# Patient Record
Sex: Female | Born: 1935 | Race: White | Hispanic: No | Marital: Married | Smoking: Never smoker
Health system: Southern US, Community
[De-identification: ages and names within clinical notes are randomized; demographics above are authoritative.]

## PROBLEM LIST (undated history)

## (undated) DIAGNOSIS — I4891 Unspecified atrial fibrillation: Secondary | ICD-10-CM

## (undated) DIAGNOSIS — M542 Cervicalgia: Secondary | ICD-10-CM

## (undated) DIAGNOSIS — M1710 Unilateral primary osteoarthritis, unspecified knee: Secondary | ICD-10-CM

## (undated) DIAGNOSIS — G4730 Sleep apnea, unspecified: Secondary | ICD-10-CM

## (undated) DIAGNOSIS — K649 Unspecified hemorrhoids: Secondary | ICD-10-CM

## (undated) DIAGNOSIS — F32A Depression, unspecified: Secondary | ICD-10-CM

## (undated) DIAGNOSIS — I639 Cerebral infarction, unspecified: Secondary | ICD-10-CM

## (undated) DIAGNOSIS — M179 Osteoarthritis of knee, unspecified: Secondary | ICD-10-CM

## (undated) DIAGNOSIS — K219 Gastro-esophageal reflux disease without esophagitis: Secondary | ICD-10-CM

## (undated) DIAGNOSIS — I10 Essential (primary) hypertension: Secondary | ICD-10-CM

## (undated) DIAGNOSIS — G8929 Other chronic pain: Secondary | ICD-10-CM

## (undated) DIAGNOSIS — G459 Transient cerebral ischemic attack, unspecified: Secondary | ICD-10-CM

## (undated) DIAGNOSIS — F0390 Unspecified dementia without behavioral disturbance: Secondary | ICD-10-CM

## (undated) DIAGNOSIS — R7303 Prediabetes: Secondary | ICD-10-CM

## (undated) DIAGNOSIS — N189 Chronic kidney disease, unspecified: Secondary | ICD-10-CM

## (undated) DIAGNOSIS — F329 Major depressive disorder, single episode, unspecified: Secondary | ICD-10-CM

## (undated) DIAGNOSIS — F419 Anxiety disorder, unspecified: Secondary | ICD-10-CM

## (undated) DIAGNOSIS — R569 Unspecified convulsions: Secondary | ICD-10-CM

## (undated) DIAGNOSIS — E785 Hyperlipidemia, unspecified: Secondary | ICD-10-CM

## (undated) DIAGNOSIS — M549 Dorsalgia, unspecified: Secondary | ICD-10-CM

## (undated) HISTORY — DX: Unilateral primary osteoarthritis, unspecified knee: M17.10

## (undated) HISTORY — DX: Anxiety disorder, unspecified: F41.9

## (undated) HISTORY — DX: Cerebral infarction, unspecified: I63.9

## (undated) HISTORY — DX: Osteoarthritis of knee, unspecified: M17.9

## (undated) HISTORY — PX: KNEE SURGERY: SHX244

## (undated) HISTORY — DX: Major depressive disorder, single episode, unspecified: F32.9

## (undated) HISTORY — DX: Sleep apnea, unspecified: G47.30

## (undated) HISTORY — DX: Hyperlipidemia, unspecified: E78.5

## (undated) HISTORY — DX: Other chronic pain: G89.29

## (undated) HISTORY — DX: Unspecified convulsions: R56.9

## (undated) HISTORY — DX: Chronic kidney disease, unspecified: N18.9

## (undated) HISTORY — DX: Dorsalgia, unspecified: M54.9

## (undated) HISTORY — DX: Gastro-esophageal reflux disease without esophagitis: K21.9

## (undated) HISTORY — DX: Depression, unspecified: F32.A

## (undated) HISTORY — DX: Unspecified hemorrhoids: K64.9

## (undated) HISTORY — DX: Unspecified atrial fibrillation: I48.91

## (undated) HISTORY — DX: Cervicalgia: M54.2

---

## 1998-07-30 ENCOUNTER — Encounter: Payer: Self-pay | Admitting: Emergency Medicine

## 1998-07-30 ENCOUNTER — Emergency Department (HOSPITAL_COMMUNITY): Admission: EM | Admit: 1998-07-30 | Discharge: 1998-07-30 | Payer: Self-pay | Admitting: Emergency Medicine

## 1998-11-02 ENCOUNTER — Other Ambulatory Visit: Admission: RE | Admit: 1998-11-02 | Discharge: 1998-11-02 | Payer: Self-pay | Admitting: Orthopaedic Surgery

## 2000-06-05 ENCOUNTER — Encounter: Admission: RE | Admit: 2000-06-05 | Discharge: 2000-06-05 | Payer: Self-pay | Admitting: Neurology

## 2000-06-05 ENCOUNTER — Encounter: Payer: Self-pay | Admitting: Neurology

## 2000-08-19 ENCOUNTER — Encounter: Admission: RE | Admit: 2000-08-19 | Discharge: 2000-08-19 | Payer: Self-pay | Admitting: Orthopedic Surgery

## 2000-08-19 ENCOUNTER — Encounter: Payer: Self-pay | Admitting: Orthopedic Surgery

## 2002-11-22 ENCOUNTER — Encounter: Payer: Self-pay | Admitting: Family Medicine

## 2002-11-22 ENCOUNTER — Ambulatory Visit (HOSPITAL_COMMUNITY): Admission: RE | Admit: 2002-11-22 | Discharge: 2002-11-22 | Payer: Self-pay | Admitting: Family Medicine

## 2002-11-29 ENCOUNTER — Ambulatory Visit (HOSPITAL_COMMUNITY): Admission: RE | Admit: 2002-11-29 | Discharge: 2002-11-29 | Payer: Self-pay | Admitting: Family Medicine

## 2002-11-29 ENCOUNTER — Encounter: Payer: Self-pay | Admitting: Family Medicine

## 2002-12-26 ENCOUNTER — Ambulatory Visit (HOSPITAL_COMMUNITY): Admission: RE | Admit: 2002-12-26 | Discharge: 2002-12-26 | Payer: Self-pay | Admitting: Gastroenterology

## 2002-12-26 ENCOUNTER — Encounter: Payer: Self-pay | Admitting: Gastroenterology

## 2003-06-07 ENCOUNTER — Inpatient Hospital Stay (HOSPITAL_COMMUNITY): Admission: RE | Admit: 2003-06-07 | Discharge: 2003-06-13 | Payer: Self-pay | Admitting: Orthopedic Surgery

## 2003-06-13 ENCOUNTER — Inpatient Hospital Stay (HOSPITAL_COMMUNITY)
Admission: RE | Admit: 2003-06-13 | Discharge: 2003-06-22 | Payer: Self-pay | Admitting: Physical Medicine & Rehabilitation

## 2004-06-04 ENCOUNTER — Ambulatory Visit (HOSPITAL_COMMUNITY): Admission: RE | Admit: 2004-06-04 | Discharge: 2004-06-04 | Payer: Self-pay | Admitting: Family Medicine

## 2004-06-04 ENCOUNTER — Other Ambulatory Visit: Admission: RE | Admit: 2004-06-04 | Discharge: 2004-06-04 | Payer: Self-pay | Admitting: Family Medicine

## 2004-07-09 ENCOUNTER — Ambulatory Visit (HOSPITAL_COMMUNITY): Admission: RE | Admit: 2004-07-09 | Discharge: 2004-07-09 | Payer: Self-pay | Admitting: Gastroenterology

## 2007-08-28 ENCOUNTER — Inpatient Hospital Stay (HOSPITAL_COMMUNITY): Admission: EM | Admit: 2007-08-28 | Discharge: 2007-09-03 | Payer: Self-pay | Admitting: Emergency Medicine

## 2009-10-18 ENCOUNTER — Ambulatory Visit: Payer: Self-pay | Admitting: Licensed Clinical Social Worker

## 2010-08-13 NOTE — Op Note (Signed)
[name], Miranda                 ACCOUNT NO.:  [id number]   MEDICAL RECORD NO.:  [id number]          PATIENT TYPE:  INP   LOCATION:  1605                         FACILITY:  [facility]   PHYSICIAN:  Ollen Gross, M.D.    DATE OF BIRTH:  1932-04-12   DATE OF PROCEDURE:  DATE OF DISCHARGE:  08/30/2007                               OPERATIVE REPORT   PREOPERATIVE DIAGNOSIS:  Right quadriceps tendon and medial collateral  ligament tear.   POSTOPERATIVE DIAGNOSIS:  Right quadriceps tendon and medial collateral  ligament tear.   PROCEDURE:  Right quadriceps tendon and medial collateral ligament  repair.   SURGEON:  Ollen Gross, M.D.   ASSISTANT:  Avel Peace, P.A.-C.   ANESTHESIA:  General.   ESTIMATED BLOOD LOSS:  Minimal.   DRAINS:  None.   TOURNIQUET TIME:  29 minutes at 300 mmHg.   COMPLICATIONS:  None.   CONDITION:  Stable to recovery.   BRIEF CLINICAL NOTE:  Heidi Miranda is a 77 year old female who had bilateral  total knee arthroplasties done several years ago.  She was doing fine  and then slipped coming out of the shower two nights ago, hyperflexing  her knee with immediate pain and inability to bear weight.  She was  taken to the emergency room, noted to have a valgus inclination in the  knee as well as laxity of the knee with possible radiographic evidence  of MCL and quadriceps tendon tears.  Dr. Darrelyn Hillock admitted her to the  hospital, and I saw her today to fix the tears.  She presents now for  operative fixation.   PROCEDURE IN DETAIL:  After the successful administration of general  anesthetic, a tourniquet was placed on the right thigh, right lower  extremity prepped and draped in the usual sterile fashion.  Extremity  was wrapped in Esmarch, tourniquet inflated to 300 mmHg.  Midline  incision was made with a 10 blade through subcutaneous tissue to level  the extensor mechanism.  Subcutaneous flaps were made.  There was  obvious tear in the medial aspect of  quadriceps.  The VMO was completely  torn off the quadriceps, and the joint was easily visible.  I thoroughly  irrigated the joint.  With valgus stressing, the joint opens way up.  I  inspected and saw that the MCL was avulsed off of the medial epicondyle.  I palpated, and there was no soft tissue coverage over the medial  epicondyle.  I found the MCL, and there was no intrasubstance tear; it  was just avulsed off of the bone.  The knee components looked fine.  I  placed two Mitek anchors in the medial epicondyle and placed the sutures  through the MCL, weaving through the tendon and sewing down to the bone  and tying the sutures to each other.  This effectively fixed the medial  instability.  I placed the valgus stress, and the joint did not open any  more.  We then used Ethibond sutures to repair the quadriceps tendon and  a tear in the retinaculum.  Fortunately, it was not torn off bone,  and  there was tear within the tissue itself.  I oversewed this to increase  the stability.  When this was complete, I was able to get the knee  flexed to 90 degrees, and it maintained the repair.  Prior to completely  finishing the repair, we thoroughly irrigated the knee.  Once the repair  was complete, I again flexed it, and we achieved 90 degrees without  tearing it apart.  The tourniquet was then released with a total time of  29 minutes.  Minor bleeding was stopped with cautery.  Subcutaneous  tissue was then closed with interrupted 2-0 Vicryl and skin with  staples.  Incision was cleaned and dried and a bulky sterile dressing  applied.  She was placed into a knee immobilizer.  A plan will be for  brace and immobilization for a least 8 weeks.  We will keep her in full  extension for the first 2 weeks, then slowly allow her to flex the knee.  By 8 weeks, we should be able to achieve at least 90 degrees of flexion.      Ollen Gross, M.D.  Electronically Signed     FA/MEDQ  D:  08/30/2007   T:  08/30/2007  Job:  [id number]   cc:   Ollen Gross, M.D.  Fax: [phone]

## 2010-08-13 NOTE — Discharge Summary (Signed)
NAMETONANTZIN, MIMNAUGH                 ACCOUNT NO.:  [id number]   MEDICAL RECORD NO.:  [id number]          PATIENT TYPE:  INP   LOCATION:  1605                         FACILITY:  [facility]   PHYSICIAN:  Ollen Gross, M.D.    DATE OF BIRTH:  [birth year]   DATE OF ADMISSION:  08/28/2007  DATE OF DISCHARGE:  09/03/2007                               DISCHARGE SUMMARY   TENTATIVE DATE OF DISCHARGE:  Today, September 03, 2007.   ADMITTING DIAGNOSES:  1. Right knee pain likely quad tendon tear.  2. History of seizure 1953.  3. History of headaches.  4. Anxiety.  5. Hypertension.  6. Hiatal hernia.  7. Past history of right elbow fracture.   DISCHARGE DIAGNOSES:  1. Right quad tendon and a medial collateral ligament tear status post      right quadriceps tendon and medial collateral tendon repair.  2. Post-op confusion, improved.   1. History of seizure 1953.  2. History of headaches.  3. Anxiety.  4. Hypertension.  5. Hiatal hernia.  6. Past history of right elbow fracture.   PROCEDURE:  Day of surgery August 30, 2007.  Repair of right quadriceps  tendon and medial collateral ligament.   SURGEON:  Dr. Lequita Halt.   ASSISTANT:  Alexzandrew L. Perkins, P.A.-C.   ANESTHESIA:  General.   BRIEF HISTORY:  Heidi Miranda is a 77 year old female who is well-known to Dr.  Ollen Gross, had undergone a right total knee back in 2005.  She has  done well with her knee up until recently.  On the date of admission,  she, unfortunately, was getting out of the shower when she stepped out  and slipped.  Her knee buckled under her causing possible rupture of the  quadriceps tendon.  She had severe pain and was unable to stand.  She  was brought to [facility] where she was seen and evaluated by  Dr. Worthy Rancher who was covering for Dr. Lequita Halt at that time.  She had  a little superficial abrasion which was cleaned and dressed.  It was  felt that she probably had a tear of her quadriceps tendon.  She was  placed at bedrest and knee immobilizer over the weekend until Dr.  Lequita Halt returned.   LABORATORY DATA:  CBC on admission hemoglobin of 14.4, hematocrit 40.3,  white cell count 7.7, platelets 192,000.  PT/INR 13.2 and 1.0 with PTT  33.  Chem panel on admission minimally elevated glucose of 110.  Remaining Chem panel within normal limits.  There is a followup CBC and  CMET pending at this time, not available at the time of this dictation.   HOSPITAL COURSE:  The patient is a 77 year old female admitted on Aug 28, 2007 by Dr. Worthy Rancher for an injury sustained after getting out of  the shower, falling and slipping and was felt she possibly could have  torn her quad tendon.  She was placed at bedrest, given p.o. and IV  analgesic for pain control.  She was placed on Lovenox protocol while at  bedrest and immobilizer, knee immobilizer at all  times.  She  unfortunately, with the injury was felt like she needed surgery.  She  had bedrest for 2 days and then seen on rounds of August 30, 2007 by Dr.  Lequita Halt. She had a fair amount of laxity and felt to be partial defect  of the quadrant tendon with a lot of plaques seen in the MCL.  Felt that  she had ruptured her quadrant tendon and possibly injured her MCL.  She  was pre-op and taken to the operating room later that day on August 30, 2007 and underwent the above-stated procedure without complication.  The  patient told procedure well, later to recovery room on orthopedic floor.  Unfortunately, that evening she had developed some confusion.  It was  noted by family that she did drink about three to five cocktails a  night.  She was placed on a little bit of Ativan withdrawal protocol.  On morning of day one postoperative, she was doing pretty well.  We  briefly discussed the findings of surgery where she had pulled away the  ligament sleeve from the femur and the medial collateral ligament, and  also partially tore up into the right neck of the  quadrant tendon.  Was  able to get it repaired.  She was placed into a locking hinged DonJoy  brace.  She is PCA post-op and that was discontinued on day one.  Blood  pressure was stable.  Started slowly getting up with therapy.  By day  two she was feeling good but she cannot move my leg due to her pain  and weakness.  She was very slow to progress and felt that she may need  skilled facility.  Discharge planning got involved.  She had some  intermittent confusion at night use the Ativan protocol.  She was  followed closely on post-op day #3.  She had some confusion the night  before, so we stopped all narcotics and she was put on Ultracet, FL-2  had been sent out, and we were looking for a bed.  She continued to  slowly progress, only walking about 20 feet by post-op day #3.  Dressing  changed on day two and day 3, incision was healing well.  By post-op day  #4 her post-op confusion had improved.  We were going to recheck some  labs on the morning of post-op day #4.  It was felt at this point that  the confusion was improved, slowly progressing.  It was felt that she  would be ready to go to a skilled facility.  Arrangements were being  made.  Labs were pending.  Possible discharge later today.   DISCHARGE/PLAN:  1. Tentative date of discharge September 03, 2007.  2. Discharge diagnoses, please see above.  3. Discharge meds:  Current medications at time of transfer include      Lovenox 30 mg q.12 h.  She needs to be on Lovenox for an additional      6 more days.  May discontinue the Lovenox after 6 days.  Verapamil      240 mg p.o. daily, Celexa 40 mg daily, Prevacid 30 mg daily, Colace      100 mg p.o. b.i.d., multivitamin p.o. daily, thiamine 100 mg p.o.      daily, Xanax 0.5 mg p.o. q.i.d. p.r.n. anxiety, laxative of choice,      enema of choice, Robaxin 100 mg p.o. q.6-8 h p.r.n. spasm, Ultracet      1 or 2  every 4 hours as needed for pain, Imodium 2 mg 1 to 2 p.o.      p.r.n. diarrhea,  Ativan 0.5 mg p.o. q.6 h p.r.n. agitation, Cepacol      lozenges p.r.n. sore throat.  4. Diet, low-sodium heart-healthy diet.  5. Activity.  She may be weightbearing as tolerated to the right leg.      However, she has a locking hinge brace that is locked in full      extension.  She needs to have the brace on at all times.  Do not      remove the brace.  Do not attempted range of motion to the knee.      She may be weightbearing as tolerated and may be up with daily      therapy for gait training ambulation.  The brace may be loosened up      for hygiene.  However, again, do not remove the brace for any      reason.  6. Followup.  She needs to follow up with Dr. Lequita Halt next Thursday on      September 09, 2007, please contact the office at [phone] to arrange      appointment time and followup for this patient to see Dr. Lequita Halt      at the [facility]      office of [facility].  7. Disposition is pending at this time.  Labs including a CBC and a      CMET are pending at this time of dictation.   CONDITION ON DISCHARGE:  Improving.      Alexzandrew L. Perkins, P.A.C.      Ollen Gross, M.D.  Electronically Signed    ALP/MEDQ  D:  09/03/2007  T:  09/03/2007  Job:  [id number]   cc:   Ollen Gross, M.D.  Fax: [phone]

## 2010-08-13 NOTE — H&P (Signed)
Heidi Miranda, Heidi Miranda           ACCOUNT NO.:  [id number]   MEDICAL RECORD NO.:  [id number]          PATIENT TYPE:  EMS   LOCATION:  ED                           FACILITY:  [facility]   PHYSICIAN:  Georges Lynch. Gioffre, M.D.DATE OF BIRTH:  03-06-1933   DATE OF ADMISSION:  08/28/2007  DATE OF DISCHARGE:                              HISTORY & PHYSICAL   HISTORY OF PRESENT ILLNESS:  I was called to [facility] emergency room  about 11:00  this evening to see [name].  She is 74.  Apparently, she fell getting out of the shower, and she sustained a  superficial laceration of her distal leg on the right, and she injured  her right knee.   She had bilateral total knee arthroplasty done approximately 5 years  ago.  She had absolutely no other injuries.   PAST MEDICAL HISTORY:  She has a history of hypertension and GERD.   IMMUNIZATIONS:  History of tetanus, unknown.   SOCIAL HISTORY:  She is a nondrinker, nonsmoker.  See under OB/GYN  history.  No significant problems.   MEDICATIONS:  Neurontin, Celexa, Xanax, verapamil.  She will get the  doses to give to the nurse of the floor.   ALLERGIES:  PENICILLIN.   PAST SURGICAL HISTORY:  She had surgery on her right elbow and she also  sustained a fracture of her shoulder in the past.   PHYSICAL EXAMINATION:  VITAL SIGNS:  Blood pressure at the beginning was  158/84, heart rate 76, respirations 18.  MENTAL STATUS:  She is alert and oriented.  EXTREMITIES:  In examining her hips, they were negative.  Her left lower  extremity was normal.  Right lower extremity, she is unable to extend  her right knee.  Her incision over the right knee looked fine.  Her knee  was quite swollen.  She had a good posterior tibial pulse.  She was able  to dorsiflex her foot on the right and her sensation was intact.   STUDIES:  X-rays of her right total knee show that there had been no  injury to the right total knee prosthesis.  She has no fractures, but  she has a displaced patella.  It is displaced inferiorly as well as it  appears as though she tore her medial retinaculum.  So basically, I  think the impression is, she has a laterally subluxed patella with  displacement distally.  This to me appears as though she ruptured her  quadriceps tendon and her medial patellar retinaculum.   PLAN:  We did clean her superficial abrasion area over her distal leg  with Betadine and Neosporin and dressed it and cleared the knee  immobilizer.  I will notify Dr. Lequita Halt  in the morning.  Also, I am  going to start her on a Lovenox protocol just for Sunday, and I will  discontinue her after midnight Sunday, but I am sure he will want to  operate on her Monday.           ______________________________  Georges Lynch. Darrelyn Hillock, M.D.     RAG/MEDQ  D:  08/28/2007  T:  08/29/2007  Job:  [id number]

## 2010-08-16 NOTE — Discharge Summary (Signed)
NAMEZAINA, Heidi Miranda                       ACCOUNT NO.:  [id number]   MEDICAL RECORD NO.:  [id number]                   PATIENT TYPE:  INP   LOCATION:  0463                                 FACILITY:  [facility]   PHYSICIAN:  Ollen Gross, M.D.                 DATE OF BIRTH:  1932-08-31   DATE OF ADMISSION:  06/07/2003  DATE OF DISCHARGE:  06/13/2003                                 DISCHARGE SUMMARY   ADMITTING DIAGNOSES:  1. Bilateral knee osteoarthritis.  2. History of seizure disorder, last episode in 1953.  3. Headaches.  4. Anxiety.  5. Hypertension.  6. Hiatal hernia.   DISCHARGE DIAGNOSES:  1. Osteoarthritis bilateral knees status post bilateral total knee     arthroplasty.  2. Postoperative blood loss anemia.  3. Status post transfusion without sequelae.  4. Postoperative hyponatremia.  5. Postoperative hypokalemia.  6. Postoperative confusion, resolved.  7. Postoperative atelectasis.  8. History of seizure disorder, last episode in 1953.  9. Headaches.  10.      Anxiety.  11.      Hypertension.  12.      Hiatal hernia.   PROCEDURE:  Date of surgery June 07, 2003:  Status post bilateral total knee  arthroplasty.  Surgeon:  Dr. Homero Fellers Aluisio.  Assistant:  Avel Peace, P.A.-  C.  Anesthesia:  General with bilateral femoral blocks.  Estimated blood  loss 300 mL.  Hemovac drain x1 both sides.  Tourniquet time 40 minutes at  300 mmHg on the right and 40 minutes at 300 mmHg on the left.   BRIEF HISTORY:  Ms. Heidi Miranda is a 77 year old female with significant end-stage  arthritis both knees.  Pain has been refractory to nonoperative management  including injection.  She now presents for bilateral total knee  arthroplasty.   CONSULTS:  Rehabilitation services.   LABORATORY DATA:  Blood gas taken on June 10, 2003 showed an FIO2 of 0.21,  pH of 7.479, PCO2 of 36.7, PO2 of 62.2, bicarb of 26.4, total CO2 of 23.7,  base excess of 3.8.  O2 saturations 89.8%.  CBC  preoperatively:  Hemoglobin  14.0, hematocrit of 41.7, white blood cell count 6.7, red cell count 4.55.  Differential all within normal limits.  Serial H&H's were followed.  Hemoglobin dropped down to 9.3 and 27.1, continued to climb down to 8.1 and  23.6, given blood.  Posttransfusion hemoglobin back up to 10.6 and  hematocrit 30.9.  PT/PTT preoperatively 12.1 and 31 respectively with INR  0.9.  Serial protimes followed; last noted PT/INR 20.3 and 2.2.  Chem panel  on admission all within normal limits with a minimally elevated total bili  of 0.2.  Serial BMETs were followed.  Sodium dropped down to 133, then down  to 132, back up to 138.  Potassium dropped down to 3.1, then to 2.9, back up  to 3.2.  Glucose went up from 93 to 157, back  down to 97.  Calcium dropped  from 9.6 down to 7.8, back up to 8.1.  Preoperative UA:  Post nitrite, trace  hemoglobin, trace leukocyte esterase, many epithelial cells, 0-2 white  cells, many bacteria.  Treated preoperatively.  Postoperative UA negative.  Blood group and type O positive.  Urine culture taken on June 09, 2003:  No  growth.  Urine culture taken on June 10, 2003:  No growth.   EKG dated June 01, 2003:  Normal sinus rhythm, frequent premature  ventricular complexes, no old tracing to compare.  Confirmed Dr. Donia Guiles.  Chest x-ray dated June 01, 2003:  No active lung disease, mild  cardiomegaly.  Portable chest on June 09, 2003:  Right lower lung  atelectasis.  CT of the chest taken on June 12, 2003:  No CT evidence of  pulmonary embolism, dependent atelectasis with focal atelectasis in superior  segment right lower lobe, no effusion.  SMALL PULMONARY NODULE RIGHT MIDDLE  LOBE AND RECOMMEND FOLLOW-UP CT SCAN IN 3 MONTHS TO REEVALUATE.   HOSPITAL COURSE:  The patient admitted to [facility], taken to the  OR, underwent above-stated procedure without complication.  The patient  tolerated the procedure well, later transferred  to the recovery room and  then to the orthopedic floor to continue postoperative care, given 24 hours  of postoperative IV antibiotics, Coumadin for DVT prophylaxis, started back  on her home medications.  PT and OT were consulted.  Rehab also consulted  postoperatively.  She was given Lovenox concomitantly with Coumadin due to  her significant surgeries.  She was placed weightbearing as tolerated both  lower extremities.  The patient was seen postoperatively by rehab, felt to  appropriate candidate for inpatient rehab.  On day #1 both the right and  left Hemovacs were pulled.  She did have some congestion, started on  Sudafed.  Fluids were made KVO.  By day #2 she was doing a little bit  better, still had some fair amount of pain.  Would leave in the PCA for now  and dressings were changed right and left knee.  Incisions looked good.  Pain pump was removed from the right knee; there was not one in the left.  By day #3 she was noted to have a drop in her sodium and potassium.  Fluids  were adjusted and potassium supplements were ordered.  She had apparently  some confusion between the night of day #2 and day #3 with elevated  temperatures, becoming quite confused throughout the weekend.  CT was  ordered as above.  CT was negative for PE; atelectasis noted.  Encouraged  antipyretics and also incentive spirometer.  Was slow to progress through  the weekend due to her confusion.  However, by postoperative day #5 the  confusion was starting to improve and resolve.  Once the confusion improved  she starting getting up a little bit more with physical therapy, up  ambulating approximately 12 feet.  She was slow to progress due to the  amount of surgery.  She was on Ativan because of the confusion to help  assist the patient.  There was a small nodule noted on the CT scan,  recommended follow-up in 3 months.  There were no rehab beds the first of the week for continued therapy.  Luckily, a rehab  bed opened up a day or so  later on June 13, 2003.  Her confusion had resolved, she was working with  therapy, her temperature was  down, she was afebrile, had a little bit of a  sore throat - she was using throat lozenges and mouth wash.  Otherwise, she  was doing quite well and was ready to be transferred to rehab.   DISCHARGE PLAN:  1. The patient transferred to [facility] on June 13, 2003.  2. Discharge diagnoses:  Please see above.  3. Discharge medications:  Percocet, Robaxin, Coumadin, Magic Mouthwash,     continue home medications as per the [facility].  4. Diet:  As tolerated.  5. Activity:  Weightbearing as tolerated both lower extremities.  Continue     with PT and OT for gait training ambulation and ADLs and total knee     protocol for bilateral total knee arthroplasty.  6. Follow up 2 weeks from surgery or following discharge from [facility].  Please contact the office at [phone] to set up an     appointment for the patient or have the patient call after she is     discharged.   DISPOSITION:  [facility].   CONDITION UPON DISCHARGE:  Slowly improving.     Alexzandrew L. Julien Girt, P.A.              Ollen Gross, M.D.    ALP/MEDQ  D:  07/12/2003  T:  07/12/2003  Job:  [id number]   cc:   Holley Bouche, M.D.  [address]  [place], [place] [address]  Fax: [phone]

## 2010-08-16 NOTE — Op Note (Signed)
NAMEMICHAELIA, BEILFUSS                       ACCOUNT NO.:  [id number]   MEDICAL RECORD NO.:  [id number]                   PATIENT TYPE:  INP   LOCATION:  0445                                 FACILITY:  [facility]   PHYSICIAN:  Ollen Gross, M.D.                 DATE OF BIRTH:  1933-03-11   DATE OF PROCEDURE:  06/07/2003  DATE OF DISCHARGE:                                 OPERATIVE REPORT   PREOPERATIVE DIAGNOSIS:  Osteoarthritis, bilateral knees.   POSTOPERATIVE DIAGNOSIS:  Osteoarthritis, bilateral knees.   PROCEDURE:  Bilateral total knee arthroplasty.   SURGEON:  Gus Rankin. Aluisio, M.D.   ASSISTANT:  Avel Peace, P.A.   ANESTHESIA:  General with bilateral femoral block.   ESTIMATED BLOOD LOSS:  300.   DRAIN:  Hemovac x 1 each side.   TOURNIQUET TIME:  Right 40 minutes at 300 mmHg, left 40 minutes at 300 mmHg.   COMPLICATIONS:  None.   CONDITION:  Stable to recovery.   BRIEF CLINICAL NOTE:  Ms. Tagliaferro is a 77 year old female with significant end-  stage arthritis, both knees with pain refractory to nonoperative management,  including injections.  She presents now for bilateral total knee  arthroplasty.   PROCEDURE IN DETAIL:  After the successful administration of general  anesthetic, Dr. Shireen Quan subsequently performed bilateral femoral blocks.  Tourniquets are then placed high on both thighs and both lower extremities  prepped and draped in the usual sterile fashion.  The right knee was more  symptomatic than the left, and we addressed that first.  The right lower  extremity is wrapped in Esmarch, knee flexed, and tourniquet inflated to  .  Midline incision is made with a 10 blade through the subcutaneous  tissue to the level of the extensor mechanism.  She had a valgus deformity  on the left, thus I made a lateral parapatellar arthrotomy.  The soft tissue  over the proximal and lateral tibia is elevated to the joint line with a  knife, and the IT band is  elevated off the __________ tubercle.  The patella  is everted medially and then the ACL and PCL removed.  A drill is used to  create a starting hole in the distal femur, and the canal is irrigated.  A 5-  degree right valgus alignment guide is placed and referencing off the  posterior condyles, rotation is marked and a block pinned to remove 10 mm  off the distal femur.  Distal femoral resection is made with an oscillating  saw.  A sizing block is placed, and a size 3 is most appropriate.  Rotation  is marked off the epicondylar axis and then the AP block placed and anterior  and posterior cuts made for size 3.   Tibia is subluxed forward, and the menisci are removed.  Extramedullary  tibial alignment guide is placed, referencing proximally at the medial  aspect of the tibial tubercle and  distally along the second metatarsal axis  and tibial crest.  The block is pinned to remove approximately 4 mm off the  deficient lateral side.  This corresponded with 8 mm off the slightly  deficient medial side.  Tibial resection is made with an oscillating saw.  Size 3 is the most appropriate tibial component, and then the proximal tibia  is prepared with the modular drill and keel punch.  Femoral preparation is  completed with the intercondylar and chamfer cuts.   Size 3 mobile bearing tibial trial, size 3 posterior stabilized femoral  trial, and a 10 mm posterior stabilized rotating platform insert trial are  placed.  Full extension is achieved with excellent varus and valgus balance  throughout full range of motion.  Patella is again everted, thickness  measured to be 22 mm, free-hand resection taken to 12 mm; 35 template is  placed; lug holes are drilled; trial patella is placed, and it tracks  normally.  Osteophytes are then removed off the posterior femur with the  trial in place.  The old trials are then removed, and the cut bone surfaces  are prepared with pulsatile lavage.  Cement is mixed  and once ready for  implantation, the size 3 mobile bearing tibial tray, size 3 posterior  stabilized femur, and 35 patella are cemented into place.  The patella is  held with a clamp.  The 10 mm trial insert is placed, knee held in full  extension, all extruded cement removed.  Once the cement is fully hardened,  then the permanent 10 mm posterior stabilized rotating platform insert is  placed into the tibial tray.  The wound is then copiously irrigated with  antibiotic solution and the extensor mechanism closed over a Hemovac drain.  Prior to this, the tourniquet was released for a total time of 40 minutes  and minor bleeding stopped with cautery.  We left open the lateral incisions  from the superior to inferior pole of the patella.  The patella tracks  normally with flexion against gravity.  It is 140 degrees.  Subcu is closed  with interrupted 2-0 Vicryl.  We then wrapped the knee in an Ace wrap and  began the left side.  The left lower extremity was wrapped in Esmarch, knee  flexed, tourniquet inflated to 300 mmHg.  The same incision is made.  We  made a medial parapatellar approach on the left side as the deformity was in  varus.  Soft tissue off the proximal and medial tibia was elevated to the  joint line with a knife and into the semimembranous bursa with a Cobb  elevator.  We removed the ACL and PCL and created a starting hole in the  distal femur.  The canal is irrigated.  The 5-degree left valgus alignment  guide is placed.  Then 10 mm are taken off the distal femur.  Size 3 was  also the size on the femoral side on the left.  Rotation is marked off the  epicondylar axis and the anterior and posterior cuts made for the size 3.   Tibia is subluxed forward, and the extramedullary tibial guide placed,  referencing proximally at the medial aspect of the tibial tubercle and  distally along the second metatarsal axis and tibial crest.  The block is pinned to remove 10 mm off the  nondeficient lateral side.  Tibial resection  is made with an oscillating saw.  Size 3 is the most appropriate tibia  component, and the proximal  tibia is prepared with the modular drill and  keel punch.  Femoral preparation is completed with the intercondylar and  chamfer cuts.   Size 3 mobile bearing tibial trial, size 3 posterior stabilized femoral  trial, and a 10 mm posterior stabilized rotating platform insert trial are  placed.  Full extension is achieved with excellent varus and valgus balance  throughout full range of motion.  The patella is everted.  Once again, it  was at 22 mm then resected to 12, and a 35 patella was placed which tracks  normally.  Osteophytes are then removed off the posterior femur with the  trial in place.  All trials are removed and cut bone surfaces prepared with  pulsatile lavage.  Cement is mixed and once ready for implantation, a size 3  mobile bearing tibial tray, size 3 posterior stabilized femur, and 35  patella are cemented into place and patella is held with a clamp.  The 10 mm  trial insert is placed, knee held in full extension and all extruded cement  removed.  Once the cement is fully hardened, then the permanent 10 mm  posterior stabilized rotating platform insert is placed.  The wound is  copiously irrigated with antibiotic solution; extensor mechanism is closed  over Hemovac drain with interrupted #1 PDS.  Flexion against gravity is 135  degrees.  Tourniquet is released for a total time of 40 minutes.  Subcu is  closed with interrupted 2-0 Vicryl and then on both sides, the subcuticular  layer is  closed with a running 4-0 Monocryl.  We placed a Marcaine pain pump into the  right knee.  We initiated the pump at the conclusion of procedure.  Bulky  sterile dressings are applied on both sides. and the Hemovac is hooked to  suction.  She is then placed into knee immobilizer, awakened, and  transported to recovery in stable condition.                                                Ollen Gross, M.D.    FA/MEDQ  D:  06/07/2003  T:  06/08/2003  Job:  [id number]

## 2010-08-16 NOTE — Discharge Summary (Signed)
Heidi Miranda, Heidi Miranda                       ACCOUNT NO.:  [id number]   MEDICAL RECORD NO.:  [id number]                   PATIENT TYPE:  IPS   LOCATION:  4140                                 FACILITY:  [facility]   PHYSICIAN:  Ranelle Oyster, M.D.             DATE OF BIRTH:  13-Sep-1932   DATE OF ADMISSION:  06/13/2003  DATE OF DISCHARGE:  06/22/2003                                 DISCHARGE SUMMARY   DISCHARGE DIAGNOSES:  1. Bilateral total knee arthroplasty secondary to osteoarthritis.  2. History of chronic neck pain with neuropathy.  3. History of hypertension.  4. Hypokalemia, resolved.  5. History of anxiety/depression.  6. Sore throat, improved.  7. Small pulmonary nodule.   HISTORY OF PRESENT ILLNESS:  The patient is a 77 year old white female with  a past medical history of bilateral knee pain and end-stage DJD.  She failed  conservative care and elective to undergo a bilateral total knee  arthroplasty at [facility] on June 07, 2003, by Ollen Gross,  M.D.  The patient was placed on Coumadin for DVT prophylaxis.  The PT report  at this time indicates that the patient is ambulate about 12 feet with a  rolling walker, transfer total with assistance, and total assistance for bed  mobility.  Hospital course significant for anemia, status post transfusion,  hypokalemia, agitation, confusion, sore throat, and small pulmonary nodule  for CT.  The patient was transferred to the [facility] on June 13, 2003.   PAST MEDICAL HISTORY:  Significant for:  1. Hypertension.  2. Chronic neck pain.  3. Hiatal hernia.  4. Right elbow fracture.  5. Nerve injury in the neck in 1993.  6. Anxiety.   PAST SURGICAL HISTORY:  Significant for:  1. Right ORIF of the right elbow.  2. Hysterectomy.   REVIEW OF SYSTEMS:  Denies any chest pain, shortness of breath, nausea, or  vomiting.   ALLERGIES:  PENICILLIN.   PRIMARY CARE PHYSICIAN:  Holley Bouche,  M.D.   FAMILY HISTORY:  Noncontributory.   SOCIAL HISTORY:  The patient lives with her husband in a one-level home in  [place], [place].  One step to entry.  Independent prior to  admission.  Four living children.  Denies any tobacco or alcohol use.  Retired from the [employer].   MEDICATIONS PRIOR TO ADMISSION:  1. __________.  2. Neurontin 300 mg one tablet b.i.d.  3. Xanax 0.5 mg q.h.s.  4. Premarin 0.3 mg daily.  5. Prilosec 10 mg daily.  6. Verapamil 240 mg daily.   HOSPITAL COURSE:  Heidi Miranda was admitted to the [facility] on June 13, 2003, for comprehensive inpatient  rehabilitation and received more than three hours of therapy daily.  Overall  Heidi Miranda progressed fairly well during her stay in rehabilitation and  was discharged at a modified independent level.  She was  able to tolerate  therapies very well.  Her hospital course was significant for sore throat,  mild hypokalemia, constipation, and urinary frequency.  The patient remained  on Neurontin because of a history of neuropathy secondary to her neck  injury.  Her blood pressure remained under reasonably good control on  verapamil 240 mg p.o. daily.  No adjustments in this medication were needed.  The patient remained anemic throughout most of her stay in rehabilitation.  The latest hemoglobin was 11.0 with a hematocrit of 31.0.  She discharged  home on Trinsicon one tablet p.o. b.i.d.  The patient did have some  difficulty in range of motion of the left knee.  She only got approximately  30-40 degrees of flexion.  The patient's right knee progressed fairly well  with 60-90 degrees of flexion.  The patient was discharged home with CPM  machine.   Prior to admission, the patient had a potassium level of 3.2.  She remained  on K-Dur 20 mEq b.i.d.  The latest potassium level was 3.5.  Therefore,  potassium was discontinued at the time of rehabilitation.  The  patient was  complaining off and on of a sore throat and difficulty swallowing.  The  patient received Magic mouthwash, as well as Nystatin as needed.  Sore  throat did eventually improve.   The patient does have a history of having a pulmonary nodule seen by chest x-  ray and followup CT was recommended in three months.  The patient probably  will need a followup CT performed by primary care doctor.  The patient had  no significant shortness of breath or chest pain while in rehabilitation.  The patient's pain has been controlled with Robaxin with oxycodone.  The  patient was also followed up while in rehabilitation by Dr. Lequita Halt.  Her  incisions healed very well, demonstrating no signs of infection.  The  patient had about 1+ edema in both knees bilaterally.  The patient received  Sorbitol and Senokot S as needed for constipation.  There were no other  major issues occur while the patient was in rehabilitation.  She had a  urinalysis performed due to slight increase in urinary frequency, which was  negative.  Urine culture is negative presently at this time.  The hemoglobin  was 11.0, hematocrit 31.0, white blood cell count 6.4, and platelets 252.  The latest AST was 57, ALT 54, sodium 137, potassium 3.5, chloride 100, CO2  25, glucose 94, BUN 9, and creatinine 0.9.  PT at the time of discharge  indicates that the patient is modified independently and able to ambulate  greater than 100 feet with a rolling walker.  Decreased range of motion of  the left knee.  Right knee with about 60-80 degrees of flexion.  The patient  can perform most ADLs modified independently.  The patient was discharged  home with a CPM machine.  She was discharged home with her __________  at  this time.   DISCHARGE MEDICATIONS:  1. Celexa 40 mg p.o. daily.  2. Neurontin 300 mg b.i.d.  3. Trinsicon one tablet twice daily.  4. Thiamine 100 mg one p.o. daily. 5. Verapamil 240 mg daily.  6. Coumadin 2.5 mg in  p.m.  7. Xanax 0.5 mg at night.  8. Robaxin 500 mg one to two tablets every six to eight hours as needed.  9. Oxycodone 5-10 mg every four to six hours as needed for pain.   SPECIAL INSTRUCTIONS:  No aspirin, ibuprofen,  or Aleve while on Coumadin.  Pain management will be oxycodone and Tylenol.   ACTIVITY:  No driving.  No lifting.  No smoking.  Use CPM to left knee.  Use  walker.  No drinking alcohol.   FOLLOWUP:  [facility] for PT, OT, and a nurse.  Nurse to check INR  on June 23, 2003.  Follow up with Ollen Gross, M.D., in two weeks.  Follow up with Holley Bouche, M.D., in to monitor pulmonary nodule in about  three months and to check anemia.      Drucilla Schmidt, P.A.                         Ranelle Oyster, M.D.    LB/MEDQ  D:  06/22/2003  T:  06/24/2003  Job:  [id number]   cc:   Holley Bouche, M.D.  [address]  [place], [place] [address]  Fax: [phone]   Ollen Gross, M.D.  [facility]  [address]  [address]  [place]  [place] [address]  Fax: [phone]

## 2010-08-16 NOTE — H&P (Signed)
Heidi Miranda, Heidi Miranda                       ACCOUNT NO.:  [id number]   MEDICAL RECORD NO.:  [id number]                   PATIENT TYPE:  INP   LOCATION:  0463                                 FACILITY:  [facility]   PHYSICIAN:  Ollen Gross, M.D.                 DATE OF BIRTH:  26-Jan-1933   DATE OF ADMISSION:  06/07/2003  DATE OF DISCHARGE:  06/13/2003                                HISTORY & PHYSICAL   CHIEF COMPLAINT:  Bilateral knee pain.   HISTORY OF PRESENT ILLNESS:  This is a 77 year old female seen by Dr.  Lequita Halt for ongoing knee pain, several year history of progressively  worsening knee pain that started on the right, but is now affecting the left  to the same extent. She has never had any injury. She was seen by Dr.  Madelon Lips a couple of years ago and told she needed knee replacements. She and  her husband are good friends with a patient of Dr. Aluisio's who recommended  that she come over and be evaluated. She is seen and found to have  significant close to bone-on-bone arthritis on the right knee with near bone-  on-bone arthritis also on the left knee. She has previously undergone  injections and still has continued pain due to the fact she is not improved  and it was thought she would benefit from undergoing knee surgery. The risks  and benefits of the procedure have been discussed with the patient and she  elects to proceed with surgery.  The patient requests that she have both  knees done at the same time. She is requesting to undergo bilateral knee  replacements.   ALLERGIES:  PENICILLIN causes hives and rash.   CURRENT MEDICATIONS:  1. Gabapentin.  2. Neurontin 300 mg daily.  3. Ibuprofen 800 mg one to three times a day as needed.  4. Prevacid 30 mg daily.  5. Xanax 0.5 mg b.i.d.  6. Premarin 0.3 mg daily.  7. Verapamil 240 mg daily.  8. Citalopram 40 mg daily.  9. Vicodin as needed.  10.      Fibercon as needed.   PAST MEDICAL HISTORY:  1. History of  seizures in 1953.  2. History of headaches.  3. Anxiety.  4. Hypertension.  5. Hiatal hernia.  6. History of right elbow fracture.   PAST SURGICAL HISTORY:  1. History of ORIF right elbow.  2. Partial hysterectomy approximately 30 years ago.  3. Excision of a Bartholin's cyst.   SOCIAL HISTORY:  The patient is married, retired, nonsmoker. Social intake  of alcohol. She has six children, two of which are deceased.   FAMILY HISTORY:  Father with history of stroke. Mother living, age 61, with  a history of arthritis. She has an aunt with a history of stroke.   REVIEW OF SYSTEMS:  GENERAL: No fever, chills, nightsweats. NEUROLOGIC: She  has had a previous history of seizures with  last episode back in 1953 with  some anxiety and headaches. She also states she had a mental breakdown  approximately 50 years ago. No recent seizures, syncope, or paralysis.  RESPIRATORY:  No shortness of breath, productive cough, or hemoptysis.  CARDIOVASCULAR: No chest pain, angina, or orthopnea. GI: She does have some  constipation. She is on Fibercon. No nausea, vomiting, or diarrhea. No blood  or mucus in the stool.  GU: No dysuria, hematuria, or discharge.  MUSCULOSKELETAL: Pertinent to that of the knees found in the history of  present illness.   PHYSICAL EXAMINATION:  VITAL SIGNS: Pulse 72, respirations 12, blood  pressure 136/88.  GENERAL: A 76 year old white female, well-developed well-nourished, alert,  oriented, cooperative, and pleasant; mildly anxious. Daughter accompanies  the patient.  HEENT:  Normocephalic and atraumatic. Pupils are round and reactive. EOMs  are intact. The patient does wear glasses.  NECK: Supple.  CHEST: Clear to auscultation in the anterior and posterior chest wall.  HEART: Regular rate and rhythm.  S1 and S2 noted.  ABDOMEN: Soft, nontender. Bowel sounds are present.  RECTAL/BREASTS/GENITALIA: Not done; not pertinent to the present illness.  EXTREMITIES: Right knee  shows range of motion of 6 to 110 degrees with a  valgus deformity, moderate crepitus noted. Left knee shows range of motion  of 0 to 110 degrees, moderate crepitus noted. Motor function intact  bilaterally.   IMPRESSION:  1. Bilateral knee end-stage osteoarthritis.  2. History of seizure disorder (last episode in 1953).  3. Headaches.  4. Anxiety.  5. Hypertension.  6. Hiatal hernia.   PLAN:  The patient will be admitted to [facility] to  undergo bilateral total knee replacement arthroplasty. Surgery will be  performed by Dr. Ollen Gross.     Alexzandrew L. Julien Girt, P.A.              Ollen Gross, M.D.    ALP/MEDQ  D:  06/13/2003  T:  06/13/2003  Job:  [id number]   cc:   Holley Bouche, M.D.  [address]  [place], [place] [address]  Fax: [phone]   Ollen Gross, M.D.  [facility]  [address]  [address]  [place]  [place] [address]  Fax: [phone]

## 2010-08-16 NOTE — Op Note (Signed)
Heidi Miranda, Heidi Miranda           ACCOUNT NO.:  [id number]   MEDICAL RECORD NO.:  [id number]          PATIENT TYPE:  AMB   LOCATION:  ENDO                         FACILITY:  [facility]   PHYSICIAN:  Petra Kuba, M.D.    DATE OF BIRTH:  12/12/1932   DATE OF PROCEDURE:  07/09/2004  DATE OF DISCHARGE:                                 OPERATIVE REPORT   PROCEDURE:  Colonoscopy.   INDICATIONS:  Change of bowel habits.  Due for colonic screening.  Consent  was signed after risks, benefits, methods, and options were thoroughly  discussed in the office.   PREMEDICATIONS:  Demerol 70 mg, Versed 8 mg.   DESCRIPTION OF PROCEDURE:  Rectal inspection pertinent for external  hemorrhoids, small.  Digital exam was negative.  Video pediatric adjustable  colonoscope was inserted and with abdominal pressure able to be advanced  around the colon to the cecum.  Left-sided diverticula were seen on  insertion but no other abnormalities.  The cecum was identified by the  appendiceal orifice and the ileocecal valve.  In fact, the scope was  inserted a short ways into the terminal ileum which was normal.  Photo  documentation was obtained.  The scope was slowly withdrawn.  Prep was  adequate.  There was some liquid stool that required washing and suctioning.  On slow withdrawal through the colon, other than the scattered left-sided  diverticula, no polyps, tumors, masses, or otherwise abnormalities were seen  as we slowly withdrew back to the rectum.  Anorectal pull-through and  retroflexion confirmed some small hemorrhoids.  The scope was straightened  and readvanced a short ways up the left side of the colon.  Air was  suctioned and the scope removed.  The patient tolerated the procedure well.  There were no obvious immediate complications.   ENDOSCOPIC DIAGNOSES:  1.  Internal and external hemorrhoids.  2.  Left-sided diverticula, some.  3.  Otherwise within normal limits to the terminal ileum.   PLAN:   Consider repeat screening in five to 10 years if doing well  medically.  Happy to see back p.r.n.  Otherwise return care to Dr. Tiburcio Pea  for the customary health care maintenance to include yearly rectals and  guaiacs.   Anorectal pull-through      MEM/MEDQ  D:  07/09/2004  T:  07/09/2004  Job:  [id number]   cc:   Holley Bouche, M.D.  [address]  [place], [place] [address]  Fax: [phone]

## 2010-08-16 NOTE — Discharge Summary (Signed)
Heidi Miranda, Heidi Miranda                       ACCOUNT NO.:  [id number]   MEDICAL RECORD NO.:  [id number]                   PATIENT TYPE:  IPS   LOCATION:  4140                                 FACILITY:  [facility]   PHYSICIAN:  Ranelle Oyster, M.D.             DATE OF BIRTH:  [birth year]   DATE OF ADMISSION:  06/13/2003  DATE OF DISCHARGE:  06/22/2003                                 DISCHARGE SUMMARY   No dictation.      Drucilla Schmidt, P.A.                         Ranelle Oyster, M.D.    LB/MEDQ  D:  06/22/2003  T:  06/22/2003  Job:  [id number]   cc:   Ranelle Oyster, M.D.  [address] [place]  [place] [address]  Fax: [phone]   Ollen Gross, M.D.  [facility]  [address]  [address]  [place]  [place] [address]  Fax: [phone]   Holley Bouche, M.D.  [address]  [place], [place] [address]  Fax: [phone]

## 2010-12-25 LAB — COMPREHENSIVE METABOLIC PANEL
ALT: 24
AST: 26
Albumin: 3.6
BUN: 10
Calcium: 9.2
Glucose, Bld: 110 — ABNORMAL HIGH

## 2010-12-25 LAB — CBC
Platelets: 192
RBC: 4.51
WBC: 7.7

## 2010-12-25 LAB — PROTIME-INR: INR: 1

## 2010-12-25 LAB — APTT: aPTT: 33

## 2010-12-26 LAB — BASIC METABOLIC PANEL
BUN: 10
Chloride: 105
Potassium: 3.8

## 2010-12-26 LAB — CBC
Hemoglobin: 11.6 — ABNORMAL LOW
MCV: 92
Platelets: 186
RDW: 12.6
WBC: 5.9

## 2012-03-22 ENCOUNTER — Encounter (HOSPITAL_COMMUNITY): Payer: Self-pay

## 2012-03-22 ENCOUNTER — Emergency Department (HOSPITAL_COMMUNITY): Payer: Medicare Other

## 2012-03-22 ENCOUNTER — Emergency Department (HOSPITAL_COMMUNITY)
Admission: EM | Admit: 2012-03-22 | Discharge: 2012-03-22 | Disposition: A | Discharge status: Home / Self Care | Payer: Medicare Other | Attending: Emergency Medicine | Admitting: Emergency Medicine

## 2012-03-22 DIAGNOSIS — Z638 Other specified problems related to primary support group: Secondary | ICD-10-CM | POA: Insufficient documentation

## 2012-03-22 DIAGNOSIS — R110 Nausea: Secondary | ICD-10-CM | POA: Insufficient documentation

## 2012-03-22 DIAGNOSIS — M6281 Muscle weakness (generalized): Secondary | ICD-10-CM | POA: Insufficient documentation

## 2012-03-22 DIAGNOSIS — R531 Weakness: Secondary | ICD-10-CM

## 2012-03-22 HISTORY — DX: Cerebral infarction, unspecified: I63.9

## 2012-03-22 HISTORY — DX: Essential (primary) hypertension: I10

## 2012-03-22 LAB — URINALYSIS, ROUTINE W REFLEX MICROSCOPIC
Glucose, UA: NEGATIVE mg/dL
Nitrite: NEGATIVE
Protein, ur: NEGATIVE mg/dL
Specific Gravity, Urine: 1.029 (ref 1.005–1.030)

## 2012-03-22 LAB — COMPREHENSIVE METABOLIC PANEL
BUN: 23 mg/dL (ref 6–23)
Chloride: 106 mEq/L (ref 96–112)
Creatinine, Ser: 1.29 mg/dL — ABNORMAL HIGH (ref 0.50–1.10)
GFR calc Af Amer: 45 mL/min — ABNORMAL LOW (ref >90)
GFR calc non Af Amer: 39 mL/min — ABNORMAL LOW (ref >90)
Glucose, Bld: 198 mg/dL — ABNORMAL HIGH (ref 70–99)
Potassium: 4.2 mEq/L (ref 3.5–5.1)
Total Bilirubin: 0.7 mg/dL (ref 0.3–1.2)
Total Protein: 6.7 g/dL (ref 6.0–8.3)

## 2012-03-22 LAB — URINE MICROSCOPIC-ADD ON

## 2012-03-22 LAB — CBC WITH DIFFERENTIAL/PLATELET
Lymphocytes Relative: 13 % (ref 12–46)
Neutro Abs: 10.3 10*3/uL — ABNORMAL HIGH (ref 1.7–7.7)
Neutrophils Relative %: 81 % — ABNORMAL HIGH (ref 43–77)
Platelets: 156 10*3/uL (ref 150–400)
RDW: 13.1 % (ref 11.5–15.5)
WBC: 12.7 10*3/uL — ABNORMAL HIGH (ref 4.0–10.5)

## 2012-03-22 LAB — ETHANOL: Alcohol, Ethyl (B): <11 mg/dL (ref 0–11)

## 2012-03-22 NOTE — ED Notes (Signed)
Daughter number: [phone] [place] [address]

## 2012-03-22 NOTE — ED Provider Notes (Signed)
Medical screening examination/treatment/procedure(s) were conducted as a shared visit with non-physician practitioner(s) and myself.  I personally evaluated the patient during the encounter.  Patient presents from home with complaint of weakness in her lower legs described as "Jell-O". She had similar symptoms 4 days ago. She is a very difficult historian, with changing histories depending on who is asking her questions. She has new renal insufficiency and elevated LFTs. She does report having a nightly cocktail. Later in the encounter was noted she was hypoxic at times she returned that she has a history of sleep apnea and is usually on CPAP at night. Patient was weak with standing at the bedside. Plan is for right upper quadrant ultrasound to followup on these abnormal LFTs. She also reports she had nausea and abdominal pain with dry heaving just prior to her weak spell.  Olivia Mackie, MD 03/22/12 [phone]

## 2012-03-22 NOTE — ED Provider Notes (Signed)
Medical screening examination/treatment/procedure(s) were performed by non-physician practitioner and as supervising physician I was immediately available for consultation/collaboration.  Iwao Shamblin R. Davante Gerke, MD 03/22/12 1024 

## 2012-03-22 NOTE — ED Notes (Signed)
Pt alert, arrives from home, pt got up to go to restroom, felt weak to lower ext, sat on the floor, denies LOC, pt alert, resp even unlabored, skin pwd, IV 20 g est to left AC, 4mg  Zofran given IVP

## 2012-03-22 NOTE — ED Notes (Signed)
Bed:WA15<BR> Expected date:<BR> Expected time:<BR> Means of arrival:<BR> Comments:<BR> EMS

## 2012-03-22 NOTE — ED Provider Notes (Signed)
Pt seen by Ivonne Andrew, PA-C and Dr. Norlene Campbell originally for lower extremity weakness pending abd [place].  After the Ultrasound I went to re-evaluate pt and she confided in me that she is miserable in her marriage and wants to divorce him. She tells me that she has spoken with all of her kids about this. One daughter in [place] has already found her a place to stay so that she can move out their., Her second daughter in [place] is also looking for a place their so she can move with her possibly. She says that he is "An angry, domineering [place] and I am leaving him. He does not know this yet". She denies feeling in danger at home. She denies his anger escalating or hx of physical abuse.  Originally the patient wanted to be admitted and stay, then after discussing with her daughters she wants to be home for the holidays. She has a Veterinary surgeon in [place] and another one in [place]. He plans to talk to the counselor in [place] after the holidays.She declines my offer to have social work come talk to her. Although I urged her to.  She says her house is one story and every thing is very easily accessible. The pt is now ademit that she goes home and does not want to stay over night.   We ambulated her in exam room. She had no difficulties with this and showed no signs of weakness.  Her work-up showed some new onset renal insuff and increased liver enzymes. Her gall bladder has some sludge any but no acute liver findings, gall bladder or renal findings.   Results for orders placed during the hospital encounter of 03/22/12  CBC WITH DIFFERENTIAL      Component Value Range   WBC 12.7 (*) 4.0 - 10.5 K/uL   RBC 4.11  3.87 - 5.11 MIL/uL   Hemoglobin 12.6  12.0 - 15.0 g/dL   HCT 16.1  09.6 - 04.5 %   MCV 90.5  78.0 - 100.0 fL   MCH 30.7  26.0 - 34.0 pg   MCHC 33.9  30.0 - 36.0 g/dL   RDW 40.9  81.1 - 91.4 %   Platelets 156  150 - 400 K/uL   Neutrophils Relative 81 (*) 43 - 77 %   Neutro Abs 10.3 (*) 1.7 - 7.7 K/uL    Lymphocytes Relative 13  12 - 46 %   Lymphs Abs 1.7  0.7 - 4.0 K/uL   Monocytes Relative 5  3 - 12 %   Monocytes Absolute 0.6  0.1 - 1.0 K/uL   Eosinophils Relative 1  0 - 5 %   Eosinophils Absolute 0.1  0.0 - 0.7 K/uL   Basophils Relative 0  0 - 1 %   Basophils Absolute 0.0  0.0 - 0.1 K/uL  COMPREHENSIVE METABOLIC PANEL      Component Value Range   Sodium 139  135 - 145 mEq/L   Potassium 4.2  3.5 - 5.1 mEq/L   Chloride 106  96 - 112 mEq/L   CO2 20  19 - 32 mEq/L   Glucose, Bld 198 (*) 70 - 99 mg/dL   BUN 23  6 - 23 mg/dL   Creatinine, Ser 7.82 (*) 0.50 - 1.10 mg/dL   Calcium 9.1  8.4 - 95.6 mg/dL   Total Protein 6.7  6.0 - 8.3 g/dL   Albumin 2.9 (*) 3.5 - 5.2 g/dL   AST 95 (*) 0 - 37 U/L   ALT  129 (*) 0 - 35 U/L   Alkaline Phosphatase 183 (*) 39 - 117 U/L   Total Bilirubin 0.7  0.3 - 1.2 mg/dL   GFR calc non Af Amer 39 (*) >90 mL/min   GFR calc Af Amer 45 (*) >90 mL/min  URINALYSIS, ROUTINE W REFLEX MICROSCOPIC      Component Value Range   Color, Urine AMBER (*) YELLOW   APPearance CLOUDY (*) CLEAR   Specific Gravity, Urine 1.029  1.005 - 1.030   pH 5.0  5.0 - 8.0   Glucose, UA NEGATIVE  NEGATIVE mg/dL   Hgb urine dipstick TRACE (*) NEGATIVE   Bilirubin Urine NEGATIVE  NEGATIVE   Ketones, ur TRACE (*) NEGATIVE mg/dL   Protein, ur NEGATIVE  NEGATIVE mg/dL   Urobilinogen, UA 1.0  0.0 - 1.0 mg/dL   Nitrite NEGATIVE  NEGATIVE   Leukocytes, UA TRACE (*) NEGATIVE  ETHANOL      Component Value Range   Alcohol, Ethyl (B) <11  0 - 11 mg/dL  URINE MICROSCOPIC-ADD ON      Component Value Range   Squamous Epithelial / LPF FEW (*) RARE   WBC, UA 3-6  <3 WBC/hpf   RBC / HPF 0-2  <3 RBC/hpf   Casts HYALINE CASTS (*) NEGATIVE   Urine-Other MUCOUS PRESENT     Dg Chest 2 View  03/22/2012  *RADIOLOGY REPORT*  Clinical Data: Status post fall; shortness of breath.  CHEST - 2 VIEW  Comparison: Chest radiograph performed 08/28/2007  Findings: The lungs are well-aerated.  Mild vascular  congestion is noted.  Mild bibasilar airspace opacities likely reflect atelectasis.  There is no evidence of pleural effusion or pneumothorax.  The heart is mildly enlarged.  No acute osseous abnormalities are seen.  IMPRESSION: Mild bibasilar airspace opacities likely reflect atelectasis; mild vascular congestion and cardiomegaly.  No displaced rib fractures seen.   Original Report Authenticated By: Tonia Ghent, M.D.    Ct Head Wo Contrast  03/22/2012  *RADIOLOGY REPORT*  Clinical Data: Leg weakness.  CT HEAD WITHOUT CONTRAST  Technique:  Contiguous axial images were obtained from the base of the skull through the vertex without contrast.  Comparison: MRI of the brain performed 01/17/2008  Findings: There is no evidence of acute infarction, mass lesion, or intra- or extra-axial hemorrhage on CT.  Prominence of the ventricles and sulci reflects moderate cortical volume loss.  Mild cerebellar atrophy is noted.  Scattered periventricular and subcortical white matter change likely reflects small vessel ischemic microangiopathy.  Small chronic infarcts are noted at the right frontoparietal region.  The brainstem and fourth ventricle are within normal limits.  The basal ganglia are unremarkable in appearance.  No mass effect or midline shift is seen.  There is no evidence of fracture; visualized osseous structures are unremarkable in appearance.  The orbits are within normal limits. The paranasal sinuses and mastoid air cells are well-aerated.  No significant soft tissue abnormalities are seen.  IMPRESSION:  1.  No acute intracranial pathology seen on CT. 2.  Moderate cortical volume loss and scattered small vessel ischemic microangiopathy. 3.  Small chronic infarcts at the right frontoparietal region.   Original Report Authenticated By: Tonia Ghent, M.D.    US Abdomen Complete  03/22/2012  *RADIOLOGY REPORT*  Clinical Data:  Abdominal pain.  Rule out gallstones  COMPLETE ABDOMINAL ULTRASOUND  Comparison:   None.  Findings:  Gallbladder:  Small amount of sludge in the gallbladder without definite gallstones.  Gallbladder wall is mildly thickened  at 7 mm. Negative sonographic Murphy's sign.  Common bile duct:  4.0 mm  Liver:  Echogenic liver diffusely suggestive of fatty infiltration. No focal liver lesion.  IVC:  Not well seen  Pancreas:  No focal abnormality seen.  Spleen:  8.2 mm  Right Kidney:  9.8 cm.  Negative for obstruction or mass  Left Kidney:  11.3 cm.  Negative for obstruction or mass.  Abdominal aorta:  Negative for aneurysm.  IMPRESSION:  Gallbladder sludge and gallbladder wall thickening.  No definite stones.  This may be due to acalculous cholecystitis.  However the patient is not focally tender over the gallbladder.   Original Report Authenticated By: Janeece Riggers, M.D.      I discussed plan with Dr. Rubin Payor. Pt is of sound mind and able to make her own decisions.   Pt has been advised of the symptoms that warrant their return to the ED. Patient has voiced understanding and has agreed to follow-up with the PCP or specialist.   Dorthula Matas, PA 03/22/12 1018

## 2012-03-22 NOTE — ED Provider Notes (Signed)
History     CSN: [id number]  Arrival date & time 03/22/12  0301   First MD Initiated Contact with Patient 03/22/12 0321      Chief Complaint  Patient presents with  . Extremity Weakness   HPI  History provided by the patient. Patient is a 78 year old female who presents with complaints of bilateral lower extremity weakness. Patient is a poor historian. Patient states that she awoke and out of bed to use the restroom when both of her legs became very weak and she could not stand on them. Patient reports having similar episode 4 days ago when she was in the shower. She reports her leg suddenly became weak and gave out and she was sitting on the shower floor. Patient denies having any pain in her legs. Denies any joint pains. She denies any other symptoms initially but then does report having nausea and episodes of dry heaving. Nausea continued in route by ambulance although she never had any episodes of vomiting. She denies having similar symptoms previously. This time she reports legs feel more like normal resting in bed. Denies any numbness or loss of feeling. Denies any headache. Denies any back or spine pains. Denies any urinary complaints.     No past medical history on file.  No past surgical history on file.  No family history on file.  History  Substance Use Topics  . Smoking status: Not on file  . Smokeless tobacco: Not on file  . Alcohol Use: Not on file    OB History    No data available      Review of Systems  HENT: Negative for neck pain.   Respiratory: Negative for shortness of breath.   Cardiovascular: Negative for chest pain and palpitations.  Musculoskeletal: Negative for back pain.  Neurological: Positive for weakness. Negative for numbness and headaches.  All other systems reviewed and are negative.    Allergies  Penicillins  Home Medications  No current outpatient prescriptions on file.  BP 112/79  Pulse 63  Temp 97.8 F (36.6 C) (Oral)   Resp 16  SpO2 91%  Physical Exam  Nursing note and vitals reviewed. Constitutional: She is oriented to person, place, and time. She appears well-developed and well-nourished. No distress.  HENT:  Head: Normocephalic.  Eyes: Pupils are equal, round, and reactive to light.  Cardiovascular: Normal rate and regular rhythm.   Pulmonary/Chest: Effort normal and breath sounds normal. No respiratory distress. She has no wheezes. She has no rales.  Abdominal: Soft. There is tenderness. There is no rebound and no guarding.       Mild diffuse upper abdominal discomforts  Neurological: She is alert and oriented to person, place, and time. She has normal strength. No cranial nerve deficit or sensory deficit.       Strength equal bilaterally  Skin: Skin is warm and dry.  Psychiatric: She has a normal mood and affect. Her behavior is normal.    ED Course  Procedures   Results for orders placed during the hospital encounter of 03/22/12  CBC WITH DIFFERENTIAL      Component Value Range   WBC 12.7 (*) 4.0 - 10.5 K/uL   RBC 4.11  3.87 - 5.11 MIL/uL   Hemoglobin 12.6  12.0 - 15.0 g/dL   HCT 78.2  95.6 - 21.3 %   MCV 90.5  78.0 - 100.0 fL   MCH 30.7  26.0 - 34.0 pg   MCHC 33.9  30.0 - 36.0 g/dL  RDW 13.1  11.5 - 15.5 %   Platelets 156  150 - 400 K/uL   Neutrophils Relative 81 (*) 43 - 77 %   Neutro Abs 10.3 (*) 1.7 - 7.7 K/uL   Lymphocytes Relative 13  12 - 46 %   Lymphs Abs 1.7  0.7 - 4.0 K/uL   Monocytes Relative 5  3 - 12 %   Monocytes Absolute 0.6  0.1 - 1.0 K/uL   Eosinophils Relative 1  0 - 5 %   Eosinophils Absolute 0.1  0.0 - 0.7 K/uL   Basophils Relative 0  0 - 1 %   Basophils Absolute 0.0  0.0 - 0.1 K/uL  COMPREHENSIVE METABOLIC PANEL      Component Value Range   Sodium 139  135 - 145 mEq/L   Potassium 4.2  3.5 - 5.1 mEq/L   Chloride 106  96 - 112 mEq/L   CO2 20  19 - 32 mEq/L   Glucose, Bld 198 (*) 70 - 99 mg/dL   BUN 23  6 - 23 mg/dL   Creatinine, Ser 9.52 (*) 0.50 - 1.10  mg/dL   Calcium 9.1  8.4 - 84.1 mg/dL   Total Protein 6.7  6.0 - 8.3 g/dL   Albumin 2.9 (*) 3.5 - 5.2 g/dL   AST 95 (*) 0 - 37 U/L   ALT 129 (*) 0 - 35 U/L   Alkaline Phosphatase 183 (*) 39 - 117 U/L   Total Bilirubin 0.7  0.3 - 1.2 mg/dL   GFR calc non Af Amer 39 (*) >90 mL/min   GFR calc Af Amer 45 (*) >90 mL/min  URINALYSIS, ROUTINE W REFLEX MICROSCOPIC      Component Value Range   Color, Urine AMBER (*) YELLOW   APPearance CLOUDY (*) CLEAR   Specific Gravity, Urine 1.029  1.005 - 1.030   pH 5.0  5.0 - 8.0   Glucose, UA NEGATIVE  NEGATIVE mg/dL   Hgb urine dipstick TRACE (*) NEGATIVE   Bilirubin Urine NEGATIVE  NEGATIVE   Ketones, ur TRACE (*) NEGATIVE mg/dL   Protein, ur NEGATIVE  NEGATIVE mg/dL   Urobilinogen, UA 1.0  0.0 - 1.0 mg/dL   Nitrite NEGATIVE  NEGATIVE   Leukocytes, UA TRACE (*) NEGATIVE  URINE MICROSCOPIC-ADD ON      Component Value Range   Squamous Epithelial / LPF FEW (*) RARE   WBC, UA 3-6  <3 WBC/hpf   RBC / HPF 0-2  <3 RBC/hpf   Casts HYALINE CASTS (*) NEGATIVE   Urine-Other MUCOUS PRESENT        Dg Chest 2 View  03/22/2012  *RADIOLOGY REPORT*  Clinical Data: Status post fall; shortness of breath.  CHEST - 2 VIEW  Comparison: Chest radiograph performed 08/28/2007  Findings: The lungs are well-aerated.  Mild vascular congestion is noted.  Mild bibasilar airspace opacities likely reflect atelectasis.  There is no evidence of pleural effusion or pneumothorax.  The heart is mildly enlarged.  No acute osseous abnormalities are seen.  IMPRESSION: Mild bibasilar airspace opacities likely reflect atelectasis; mild vascular congestion and cardiomegaly.  No displaced rib fractures seen.   Original Report Authenticated By: Tonia Ghent, M.D.    Ct Head Wo Contrast  03/22/2012  *RADIOLOGY REPORT*  Clinical Data: Leg weakness.  CT HEAD WITHOUT CONTRAST  Technique:  Contiguous axial images were obtained from the base of the skull through the vertex without contrast.   Comparison: MRI of the brain performed 01/17/2008  Findings: There is  no evidence of acute infarction, mass lesion, or intra- or extra-axial hemorrhage on CT.  Prominence of the ventricles and sulci reflects moderate cortical volume loss.  Mild cerebellar atrophy is noted.  Scattered periventricular and subcortical white matter change likely reflects small vessel ischemic microangiopathy.  Small chronic infarcts are noted at the right frontoparietal region.  The brainstem and fourth ventricle are within normal limits.  The basal ganglia are unremarkable in appearance.  No mass effect or midline shift is seen.  There is no evidence of fracture; visualized osseous structures are unremarkable in appearance.  The orbits are within normal limits. The paranasal sinuses and mastoid air cells are well-aerated.  No significant soft tissue abnormalities are seen.  IMPRESSION:  1.  No acute intracranial pathology seen on CT. 2.  Moderate cortical volume loss and scattered small vessel ischemic microangiopathy. 3.  Small chronic infarcts at the right frontoparietal region.   Original Report Authenticated By: Tonia Ghent, M.D.      No diagnosis found.    MDM  3:20 AM patient seen and evaluated. Patient hasn't complained bed. In no acute distress.  Patient seen and evaluated with attending physician. Patient is a poor historian with elevated WBC and LFTs. No specific Murphy's sign but does have some discomfort. Will obtain ultrasound. Have also added an EtOH.  Patient discussed in sign out with Ellin Saba PAC. She will follow ultrasound studies and continue to evaluate patient's stability with ambulation.     Date: 03/22/2012  Rate: 81  Rhythm: normal sinus rhythm and premature ventricular contractions (PVC)  QRS Axis: normal  Intervals: normal  ST/T Wave abnormalities: nonspecific ST/T changes  Conduction Disutrbances:none  Narrative Interpretation:   Old EKG Reviewed: changes noted frequent PVCs  new from 08/29/2007    Angus Seller, PA 03/22/12 [phone]

## 2012-03-26 ENCOUNTER — Emergency Department (HOSPITAL_COMMUNITY)
Admission: EM | Admit: 2012-03-26 | Discharge: 2012-03-26 | Discharge status: Against Medical Advice | Payer: Medicare Other

## 2012-03-26 ENCOUNTER — Encounter (HOSPITAL_COMMUNITY): Payer: Self-pay | Admitting: *Deleted

## 2012-03-26 NOTE — ED Notes (Signed)
Pt reports on Dec 23, pt fell and was treated at [facility].  Negative CT scan.  Pt reports that her legs are weak and that causes her to fall.  Pt denies falling, pain.  CVA screen negative.  No droop, drift.  PERRLA.  No slurred speech.  Pt denies pain.

## 2012-03-26 NOTE — ED Notes (Signed)
Pt reports that she was dx with MS in [place] but then states she was no longer treated for it.

## 2012-03-28 ENCOUNTER — Emergency Department (HOSPITAL_COMMUNITY): Payer: Medicare Other

## 2012-03-28 ENCOUNTER — Encounter (HOSPITAL_COMMUNITY): Payer: Self-pay | Admitting: Physical Medicine and Rehabilitation

## 2012-03-28 ENCOUNTER — Inpatient Hospital Stay (HOSPITAL_COMMUNITY)
Admission: EM | Admit: 2012-03-28 | Discharge: 2012-03-31 | DRG: 308 | Disposition: A | Discharge status: Home / Self Care | Payer: Medicare Other | Attending: Internal Medicine | Admitting: Internal Medicine

## 2012-03-28 DIAGNOSIS — R0602 Shortness of breath: Secondary | ICD-10-CM | POA: Diagnosis present

## 2012-03-28 DIAGNOSIS — F3289 Other specified depressive episodes: Secondary | ICD-10-CM | POA: Diagnosis present

## 2012-03-28 DIAGNOSIS — Z79899 Other long term (current) drug therapy: Secondary | ICD-10-CM

## 2012-03-28 DIAGNOSIS — I5021 Acute systolic (congestive) heart failure: Secondary | ICD-10-CM

## 2012-03-28 DIAGNOSIS — I509 Heart failure, unspecified: Secondary | ICD-10-CM | POA: Diagnosis present

## 2012-03-28 DIAGNOSIS — I4729 Other ventricular tachycardia: Secondary | ICD-10-CM | POA: Diagnosis present

## 2012-03-28 DIAGNOSIS — I4891 Unspecified atrial fibrillation: Principal | ICD-10-CM | POA: Diagnosis present

## 2012-03-28 DIAGNOSIS — K219 Gastro-esophageal reflux disease without esophagitis: Secondary | ICD-10-CM | POA: Diagnosis present

## 2012-03-28 DIAGNOSIS — F329 Major depressive disorder, single episode, unspecified: Secondary | ICD-10-CM | POA: Diagnosis present

## 2012-03-28 DIAGNOSIS — I5043 Acute on chronic combined systolic (congestive) and diastolic (congestive) heart failure: Secondary | ICD-10-CM | POA: Diagnosis present

## 2012-03-28 DIAGNOSIS — R6889 Other general symptoms and signs: Secondary | ICD-10-CM

## 2012-03-28 DIAGNOSIS — I428 Other cardiomyopathies: Secondary | ICD-10-CM | POA: Diagnosis present

## 2012-03-28 DIAGNOSIS — I472 Ventricular tachycardia, unspecified: Secondary | ICD-10-CM | POA: Diagnosis present

## 2012-03-28 DIAGNOSIS — E785 Hyperlipidemia, unspecified: Secondary | ICD-10-CM | POA: Diagnosis present

## 2012-03-28 DIAGNOSIS — Z7982 Long term (current) use of aspirin: Secondary | ICD-10-CM

## 2012-03-28 DIAGNOSIS — Z8673 Personal history of transient ischemic attack (TIA), and cerebral infarction without residual deficits: Secondary | ICD-10-CM

## 2012-03-28 DIAGNOSIS — I10 Essential (primary) hypertension: Secondary | ICD-10-CM | POA: Diagnosis present

## 2012-03-28 HISTORY — DX: Transient cerebral ischemic attack, unspecified: G45.9

## 2012-03-28 LAB — COMPREHENSIVE METABOLIC PANEL
ALT: 68 U/L — ABNORMAL HIGH (ref 0–35)
AST: 25 U/L (ref 0–37)
Albumin: 3.2 g/dL — ABNORMAL LOW (ref 3.5–5.2)
CO2: 24 mEq/L (ref 19–32)
Calcium: 9.2 mg/dL (ref 8.4–10.5)
Creatinine, Ser: 0.84 mg/dL (ref 0.50–1.10)
Sodium: 142 mEq/L (ref 135–145)
Total Protein: 6.7 g/dL (ref 6.0–8.3)

## 2012-03-28 LAB — CBC
MCH: 29.9 pg (ref 26.0–34.0)
MCV: 91.3 fL (ref 78.0–100.0)
Platelets: 212 10*3/uL (ref 150–400)
RBC: 4.25 MIL/uL (ref 3.87–5.11)
RDW: 13.4 % (ref 11.5–15.5)
WBC: 7.3 10*3/uL (ref 4.0–10.5)

## 2012-03-28 LAB — URINALYSIS, ROUTINE W REFLEX MICROSCOPIC
Glucose, UA: NEGATIVE mg/dL
Hgb urine dipstick: NEGATIVE
Specific Gravity, Urine: 1.024 (ref 1.005–1.030)
Urobilinogen, UA: 1 mg/dL (ref 0.0–1.0)
pH: 6 (ref 5.0–8.0)

## 2012-03-28 LAB — T4, FREE: Free T4: 1.24 ng/dL (ref 0.80–1.80)

## 2012-03-28 LAB — HEMOGLOBIN A1C: Hgb A1c MFr Bld: 5.6 % (ref <5.7)

## 2012-03-28 LAB — TROPONIN I: Troponin I: <0.3 ng/mL (ref <0.30)

## 2012-03-28 LAB — PRO B NATRIURETIC PEPTIDE: Pro B Natriuretic peptide (BNP): 3015 pg/mL — ABNORMAL HIGH (ref 0–450)

## 2012-03-28 LAB — TSH: TSH: 2.318 u[IU]/mL (ref 0.350–4.500)

## 2012-03-28 MED ORDER — ACETAMINOPHEN 650 MG RE SUPP: 650.0000 mg | Freq: Four times a day (QID) | RECTAL | Status: DC | PRN

## 2012-03-28 MED ORDER — PANTOPRAZOLE SODIUM 40 MG PO TBEC
40.0000 mg | DELAYED_RELEASE_TABLET | Freq: Every day | ORAL | Status: DC
  Administered 2012-03-28 – 2012-03-31 (×4): 40 mg via ORAL
  Filled 2012-03-28 (×5): qty 1

## 2012-03-28 MED ORDER — ONDANSETRON HCL 4 MG PO TABS: 4.0000 mg | ORAL_TABLET | Freq: Four times a day (QID) | ORAL | Status: DC | PRN

## 2012-03-28 MED ORDER — MORPHINE SULFATE 2 MG/ML IJ SOLN: 1.0000 mg | INTRAMUSCULAR | Status: DC | PRN

## 2012-03-28 MED ORDER — ONDANSETRON HCL 4 MG/2ML IJ SOLN: 4.0000 mg | Freq: Four times a day (QID) | INTRAMUSCULAR | Status: DC | PRN

## 2012-03-28 MED ORDER — ACETAMINOPHEN 325 MG PO TABS: 650.0000 mg | ORAL_TABLET | Freq: Four times a day (QID) | ORAL | Status: DC | PRN

## 2012-03-28 MED ORDER — ASPIRIN EC 81 MG PO TBEC
81.0000 mg | DELAYED_RELEASE_TABLET | Freq: Every day | ORAL | Status: DC
  Administered 2012-03-28: 81 mg via ORAL
  Filled 2012-03-28 (×2): qty 1

## 2012-03-28 MED ORDER — VERAPAMIL HCL ER 240 MG PO TBCR
240.0000 mg | EXTENDED_RELEASE_TABLET | Freq: Every morning | ORAL | Status: DC
  Administered 2012-03-29: 240 mg via ORAL
  Filled 2012-03-28: qty 1

## 2012-03-28 MED ORDER — CITALOPRAM HYDROBROMIDE 20 MG PO TABS
20.0000 mg | ORAL_TABLET | Freq: Every morning | ORAL | Status: DC
Start: 2012-03-29 — End: 2012-03-31
  Administered 2012-03-29 – 2012-03-31 (×3): 20 mg via ORAL
  Filled 2012-03-28 (×3): qty 1

## 2012-03-28 MED ORDER — POTASSIUM CHLORIDE CRYS ER 20 MEQ PO TBCR
60.0000 meq | EXTENDED_RELEASE_TABLET | Freq: Once | ORAL | Status: AC
  Administered 2012-03-28: 60 meq via ORAL
  Filled 2012-03-28: qty 3

## 2012-03-28 MED ORDER — METOPROLOL TARTRATE 25 MG PO TABS
25.0000 mg | ORAL_TABLET | Freq: Two times a day (BID) | ORAL | Status: DC
  Administered 2012-03-28 – 2012-03-29 (×3): 25 mg via ORAL
  Filled 2012-03-28 (×4): qty 1

## 2012-03-28 MED ORDER — HYDROCODONE-ACETAMINOPHEN 5-325 MG PO TABS: 1.0000 | ORAL_TABLET | ORAL | Status: DC | PRN

## 2012-03-28 MED ORDER — SODIUM CHLORIDE 0.9 % IJ SOLN
3.0000 mL | Freq: Two times a day (BID) | INTRAMUSCULAR | Status: DC
  Administered 2012-03-28 – 2012-03-30 (×6): 3 mL via INTRAVENOUS

## 2012-03-28 MED ORDER — ALPRAZOLAM 0.25 MG PO TABS: 0.2500 mg | ORAL_TABLET | Freq: Three times a day (TID) | ORAL | Status: DC | PRN

## 2012-03-28 MED ORDER — TRAZODONE 25 MG HALF TABLET
25.0000 mg | ORAL_TABLET | Freq: Every evening | ORAL | Status: DC | PRN
  Administered 2012-03-28: 25 mg via ORAL
  Filled 2012-03-28: qty 1

## 2012-03-28 MED ORDER — FUROSEMIDE 10 MG/ML IJ SOLN
40.0000 mg | Freq: Two times a day (BID) | INTRAMUSCULAR | Status: DC
  Administered 2012-03-28 – 2012-03-30 (×4): 40 mg via INTRAVENOUS
  Filled 2012-03-28 (×6): qty 4

## 2012-03-28 MED ORDER — FUROSEMIDE 10 MG/ML IJ SOLN
40.0000 mg | Freq: Once | INTRAMUSCULAR | Status: AC
  Administered 2012-03-28: 40 mg via INTRAVENOUS
  Filled 2012-03-28: qty 4

## 2012-03-28 MED ORDER — ATORVASTATIN CALCIUM 20 MG PO TABS
20.0000 mg | ORAL_TABLET | Freq: Every morning | ORAL | Status: DC
  Administered 2012-03-29 – 2012-03-31 (×3): 20 mg via ORAL
  Filled 2012-03-28 (×3): qty 1

## 2012-03-28 MED ORDER — HEPARIN SODIUM (PORCINE) 5000 UNIT/ML IJ SOLN
5000.0000 [IU] | Freq: Three times a day (TID) | INTRAMUSCULAR | Status: DC
  Administered 2012-03-28 – 2012-03-29 (×4): 5000 [IU] via SUBCUTANEOUS
  Filled 2012-03-28 (×6): qty 1

## 2012-03-28 MED ORDER — ADULT MULTIVITAMIN W/MINERALS CH
1.0000 | ORAL_TABLET | Freq: Every day | ORAL | Status: DC
  Administered 2012-03-28 – 2012-03-31 (×4): 1 via ORAL
  Filled 2012-03-28 (×4): qty 1

## 2012-03-28 MED ORDER — METOPROLOL TARTRATE 1 MG/ML IV SOLN: 5.0000 mg | Freq: Four times a day (QID) | INTRAVENOUS | Status: DC | PRN

## 2012-03-28 NOTE — H&P (Signed)
[facility] History and Physical  Heidi Miranda RUE:[id number] DOB: 1932-04-19 DOA: 03/28/2012  Referring physician: Suzi Roots, MD PCP: Sissy Hoff, MD    Chief Complaint: Shortness of breath  HPI: Heidi Miranda is a 77 y.o. female with past medical history of hypertension and history of TIA. Patient came into the hospital because of shortness of breath. Patient seems to be very forgetful and likely she does have dementia so so the history was obtained from her husband who is at bedside at the time of this interview. Patient is been short of breath for the past 3 weeks, this is been progressively worsening, started to be on exertion and progressed to be with rest. She has mild orthopnea, she reported PND as well. She did not notice a lot of lower extremity edema. Upon initial evaluation in the emergency department chest x-ray of the lungs showed mild CHF, her BNP is also slightly elevated consistent with CHF. EKG showed atrial fibrillation with rapid ventricular response patient will be admitted to the hospital for further evaluation.   Review of Systems:  Constitutional: negative for anorexia, fevers and sweats Eyes: negative for irritation, redness and visual disturbance Ears, nose, mouth, throat, and face: negative for earaches, epistaxis, nasal congestion and sore throat Respiratory: negative for cough, dyspnea on exertion, sputum and wheezing Cardiovascular: Per HPI Gastrointestinal: negative for abdominal pain, constipation, diarrhea, melena, nausea and vomiting Genitourinary:negative for dysuria, frequency and hematuria Hematologic/lymphatic: negative for bleeding, easy bruising and lymphadenopathy Musculoskeletal:negative for arthralgias, muscle weakness and stiff joints Neurological: negative for coordination problems, gait problems, headaches and weakness Endocrine: negative for diabetic symptoms including polydipsia, polyuria and weight loss Allergic/Immunologic:  negative for anaphylaxis, hay fever and urticaria   Past Medical History  Diagnosis Date  . Hypertension   . TIA (transient ischemic attack)    Past Surgical History  Procedure Date  . Knee surgery    Social History:  reports that she has never smoked. She does not have any smokeless tobacco history on file. She reports that she drinks alcohol. She reports that she does not use illicit drugs. Lives at home with her husband, he mentioned she is very forgetful likely has pneumonia.  Allergies  Allergen Reactions  . Penicillins Hives    Family History  Problem Relation Age of Onset  . Diabetes Brother      Prior to Admission medications   Medication Sig Start Date End Date Taking? Authorizing Provider  ALPRAZolam (XANAX) 0.25 MG tablet Take 0.25 mg by mouth 3 (three) times daily as needed. For anxiety or dizziness   Yes Historical Provider, MD  aspirin EC 81 MG tablet Take 81 mg by mouth at bedtime.    Yes Historical Provider, MD  atorvastatin (LIPITOR) 20 MG tablet Take 20 mg by mouth every morning.    Yes Historical Provider, MD  CALCIUM PO Take 1 tablet by mouth daily.   Yes Historical Provider, MD  Cetirizine HCl (ZYRTEC PO) Take 1 tablet by mouth daily.   Yes Historical Provider, MD  citalopram (CELEXA) 20 MG tablet Take 20 mg by mouth every morning.    Yes Historical Provider, MD  Lansoprazole (PREVACID PO) Take 1 tablet by mouth at bedtime.   Yes Historical Provider, MD  Multiple Vitamin (MULTIVITAMIN WITH MINERALS) TABS Take 1 tablet by mouth daily.   Yes Historical Provider, MD  verapamil (CALAN-SR) 240 MG CR tablet Take 240 mg by mouth every morning.    Yes Historical Provider, MD   Physical  Exam: Filed Vitals:   03/28/12 0818 03/28/12 0938 03/28/12 1219 03/28/12 1305  BP: 139/88 123/101 150/81 131/83  Pulse: 99 82 108 113  Temp: 97.4 F (36.3 C)     TempSrc: Oral     Resp: 16 18 18    SpO2: 99% 97% 96% 97%   General appearance: alert, cooperative and no distress   Head: Normocephalic, without obvious abnormality, atraumatic  Eyes: conjunctivae/corneas clear. PERRL, EOM's intact. Fundi benign.  Nose: Nares normal. Septum midline. Mucosa normal. No drainage or sinus tenderness.  Throat: lips, mucosa, and tongue normal; teeth and gums normal  Neck: Supple, no masses, no cervical lymphadenopathy, no JVD appreciated, no meningeal signs Resp: Bibasilar crackles Chest wall: no tenderness  Cardio: regular rate and rhythm, S1, S2 normal, no murmur, click, rub or gallop  GI: soft, non-tender; bowel sounds normal; no masses, no organomegaly  Extremities: extremities normal, atraumatic, no cyanosis or edema  Skin: Skin color, texture, turgor normal. No rashes or lesions  Neurologic: Alert and oriented X 3, normal strength and tone. Normal symmetric reflexes. Normal coordination and gait   Labs on Admission:  Basic Metabolic Panel:  Lab 03/28/12 1610 03/22/12 0253  NA 142 139  K 3.6 4.2  CL 106 106  CO2 24 20  GLUCOSE 89 198*  BUN 11 23  CREATININE 0.84 1.29*  CALCIUM 9.2 9.1  MG -- --  PHOS -- --   Liver Function Tests:  Lab 03/28/12 0852 03/22/12 0253  AST 25 95*  ALT 68* 129*  ALKPHOS 152* 183*  BILITOT 0.5 0.7  PROT 6.7 6.7  ALBUMIN 3.2* 2.9*   No results found for this basename: LIPASE:5,AMYLASE:5 in the last 168 hours No results found for this basename: AMMONIA:5 in the last 168 hours CBC:  Lab 03/28/12 0852 03/22/12 0253  WBC 7.3 12.7*  NEUTROABS -- 10.3*  HGB 12.7 12.6  HCT 38.8 37.2  MCV 91.3 90.5  PLT 212 156   Cardiac Enzymes:  Lab 03/28/12 0852  CKTOTAL --  CKMB --  CKMBINDEX --  TROPONINI <0.30    BNP (last 3 results)  Basename 03/28/12 1113  PROBNP 3015.0*   CBG: No results found for this basename: GLUCAP:5 in the last 168 hours  Radiological Exams on Admission: Dg Chest 2 View  03/28/2012  *RADIOLOGY REPORT*  Clinical Data: Shortness of breath  CHEST - 2 VIEW  Comparison:  03/22/2012  Findings: Mild  cardiac enlargement.  There is a small right pleural effusion.  Mild interstitial edema noted.  Atelectasis identified in the right base.  IMPRESSION:  1.  Mild CHF.   Original Report Authenticated By: Signa Kell, M.D.     EKG: Independently reviewed.   Assessment/Plan Principal Problem:  *Acute CHF Active Problems:  Atrial fibrillation  SOB (shortness of breath)  HTN (hypertension)  Forgetfulness    Acute CHF (NOS) -Obtain 2-D echocardiogram, as mentioned above chest x-ray, BNP and examination consistent with acute CHF. -Patient started on IV Lasix, started on metoprolol., check BNP in the morning. -Followup chest x-ray in 1-2 days, followup renal function closely.  Atrial fibrillation with RVR -Obtain 2-D echo, started on metoprolol 25 mg twice a day for rate control. -Patient has chads 2 score of 3 cause of age, HTN and CHF she will need anticoagulation. -Await 2-D echo to decide on anticoagulation to rule out valvular atrial fibrillation, she is on low-dose aspirin. -I will ask cardiology to evaluate her for CHF and atrial fibrillation in AM.  Hypertension -On verapamil,  continue. Metoprolol added for atrial fibrillation rate controlled.   Forgetfulness -Patient likely has dementia, per husband she is very forgetful and he has to supervise her taking her medications.  Code Status: Full code Family Communication: Spoke to her husband who was at bedside Disposition Plan: Inpatient, telemetry  Time spent: 70 minutes  [facility] Pager [phone]  If 7PM-7AM, please contact night-coverage [email or web address] Password [email or web address] 03/28/2012, 1:46 PM

## 2012-03-28 NOTE — ED Notes (Addendum)
Pt presents to department for evaluation of generalized weakness. Ongoing x3 weeks. Pt states she gets very tired and short of breath when attempting to perform daily activities. States she wants to sleep more than usual. Husband reports she has been falling frequently at home. States she becomes dizzy and nauseated, but does not pass out. Also states she has been burping and belching frequently. She is conscious alert and oriented x4. Denies pain. Able to move all extremities.

## 2012-03-28 NOTE — ED Provider Notes (Addendum)
History     CSN: [id number]  Arrival date & time 03/28/12  0807   First MD Initiated Contact with Patient 03/28/12 [phone]      Chief Complaint  Patient presents with  . Fatigue    (Consider location/radiation/quality/duration/timing/severity/associated sxs/prior treatment) The history is provided by the patient and the spouse.  pt c/o feeling generally weak for the past 2-3 weeks. No acute or abrupt worsening or change today, but states not feeling any better either. Has been to ED 2x during this period, once after a fall. Denies any recurrent fall since last ED visits. Has pcp, Swayne, but has not seen during this period. Denies any recent change in meds or new meds. No focal or unilateral numbness or weakness. No change in speech or vision. Ambulates on own, does not use cane/walker. States eating and drinking normally, although has not eaten today yet. Denies headache. No cough or uri c/o. No current or recent chest pain or discomfort. Has noted dyspnea with minimal exertion. No pnd. No abd pain. No nvd. No dysuria or gu c/o. No fever or chills.     Past Medical History  Diagnosis Date  . Hypertension   . TIA (transient ischemic attack)     Past Surgical History  Procedure Date  . Knee surgery     No family history on file.  History  Substance Use Topics  . Smoking status: Never Smoker   . Smokeless tobacco: Not on file  . Alcohol Use: Yes     Comment: daily    OB History    Grav Para Term Preterm Abortions TAB SAB Ect Mult Living                  Review of Systems  Constitutional: Negative for fever and chills.  HENT: Negative for neck pain.   Eyes: Negative for redness.  Respiratory: Positive for shortness of breath.   Cardiovascular: Negative for chest pain.  Gastrointestinal: Negative for abdominal pain.  Genitourinary: Negative for dysuria and flank pain.  Musculoskeletal: Negative for back pain.  Skin: Negative for rash.  Neurological: Negative for  numbness and headaches.  Hematological: Does not bruise/bleed easily.  Psychiatric/Behavioral: Negative for confusion.    Allergies  Penicillins  Home Medications   Current Outpatient Rx  Name  Route  Sig  Dispense  Refill  . ALPRAZOLAM 0.25 MG PO TABS   Oral   Take 0.25 mg by mouth 3 (three) times daily as needed. For anxiety or dizziness         . ASPIRIN EC 81 MG PO TBEC   Oral   Take 81 mg by mouth at bedtime.          . ATORVASTATIN CALCIUM 20 MG PO TABS   Oral   Take 20 mg by mouth every morning.          Marland Kitchen CALCIUM PO   Oral   Take 1 tablet by mouth daily.         Marland Kitchen ZYRTEC PO   Oral   Take 1 tablet by mouth daily.         Marland Kitchen CITALOPRAM HYDROBROMIDE 20 MG PO TABS   Oral   Take 20 mg by mouth every morning.          Marland Kitchen PREVACID PO   Oral   Take 1 tablet by mouth at bedtime.         . ADULT MULTIVITAMIN W/MINERALS CH   Oral   Take 1  tablet by mouth daily.         Marland Kitchen VERAPAMIL HCL ER 240 MG PO TBCR   Oral   Take 240 mg by mouth every morning.            BP 139/88  Pulse 99  Temp 97.4 F (36.3 C) (Oral)  Resp 16  SpO2 99%  Physical Exam  Nursing note and vitals reviewed. Constitutional: She is oriented to person, place, and time. She appears well-developed and well-nourished. No distress.  HENT:  Head: Atraumatic.  Nose: Nose normal.  Mouth/Throat: Oropharynx is clear and moist.  Eyes: Conjunctivae normal are normal. Pupils are equal, round, and reactive to light. No scleral icterus.  Neck: Neck supple. No tracheal deviation present.       No bruit  Cardiovascular: Normal rate, regular rhythm, normal heart sounds and intact distal pulses.   Pulmonary/Chest: Effort normal and breath sounds normal. No respiratory distress.  Abdominal: Soft. Normal appearance and bowel sounds are normal. She exhibits no distension.  Genitourinary:       No cva tenderness  Musculoskeletal: She exhibits no edema and no tenderness.  Neurological: She  is alert and oriented to person, place, and time. No cranial nerve deficit.       Motor intact bil.   Skin: Skin is warm and dry. No rash noted.  Psychiatric: She has a normal mood and affect.    ED Course  Procedures (including critical care time)   Labs Reviewed  TROPONIN I  CBC  COMPREHENSIVE METABOLIC PANEL  URINALYSIS, ROUTINE W REFLEX MICROSCOPIC  TSH  T4, FREE   Results for orders placed during the hospital encounter of 03/28/12  TROPONIN I      Component Value Range   Troponin I <0.30  <0.30 ng/mL  CBC      Component Value Range   WBC 7.3  4.0 - 10.5 K/uL   RBC 4.25  3.87 - 5.11 MIL/uL   Hemoglobin 12.7  12.0 - 15.0 g/dL   HCT 69.6  29.5 - 28.4 %   MCV 91.3  78.0 - 100.0 fL   MCH 29.9  26.0 - 34.0 pg   MCHC 32.7  30.0 - 36.0 g/dL   RDW 13.2  44.0 - 10.2 %   Platelets 212  150 - 400 K/uL  COMPREHENSIVE METABOLIC PANEL      Component Value Range   Sodium 142  135 - 145 mEq/L   Potassium 3.6  3.5 - 5.1 mEq/L   Chloride 106  96 - 112 mEq/L   CO2 24  19 - 32 mEq/L   Glucose, Bld 89  70 - 99 mg/dL   BUN 11  6 - 23 mg/dL   Creatinine, Ser 7.25  0.50 - 1.10 mg/dL   Calcium 9.2  8.4 - 36.6 mg/dL   Total Protein 6.7  6.0 - 8.3 g/dL   Albumin 3.2 (*) 3.5 - 5.2 g/dL   AST 25  0 - 37 U/L   ALT 68 (*) 0 - 35 U/L   Alkaline Phosphatase 152 (*) 39 - 117 U/L   Total Bilirubin 0.5  0.3 - 1.2 mg/dL   GFR calc non Af Amer 65 (*) >90 mL/min   GFR calc Af Amer 76 (*) >90 mL/min  URINALYSIS, ROUTINE W REFLEX MICROSCOPIC      Component Value Range   Color, Urine AMBER (*) YELLOW   APPearance CLEAR  CLEAR   Specific Gravity, Urine 1.024  1.005 - 1.030  pH 6.0  5.0 - 8.0   Glucose, UA NEGATIVE  NEGATIVE mg/dL   Hgb urine dipstick NEGATIVE  NEGATIVE   Bilirubin Urine SMALL (*) NEGATIVE   Ketones, ur 15 (*) NEGATIVE mg/dL   Protein, ur NEGATIVE  NEGATIVE mg/dL   Urobilinogen, UA 1.0  0.0 - 1.0 mg/dL   Nitrite NEGATIVE  NEGATIVE   Leukocytes, UA NEGATIVE  NEGATIVE  PRO B  NATRIURETIC PEPTIDE      Component Value Range   Pro B Natriuretic peptide (BNP) 3015.0 (*) 0 - 450 pg/mL   Dg Chest 2 View  03/28/2012  *RADIOLOGY REPORT*  Clinical Data: Shortness of breath  CHEST - 2 VIEW  Comparison:  03/22/2012  Findings: Mild cardiac enlargement.  There is a small right pleural effusion.  Mild interstitial edema noted.  Atelectasis identified in the right base.  IMPRESSION:  1.  Mild CHF.   Original Report Authenticated By: Signa Kell, M.D.    Dg Chest 2 View  03/22/2012  *RADIOLOGY REPORT*  Clinical Data: Status post fall; shortness of breath.  CHEST - 2 VIEW  Comparison: Chest radiograph performed 08/28/2007  Findings: The lungs are well-aerated.  Mild vascular congestion is noted.  Mild bibasilar airspace opacities likely reflect atelectasis.  There is no evidence of pleural effusion or pneumothorax.  The heart is mildly enlarged.  No acute osseous abnormalities are seen.  IMPRESSION: Mild bibasilar airspace opacities likely reflect atelectasis; mild vascular congestion and cardiomegaly.  No displaced rib fractures seen.   Original Report Authenticated By: Tonia Ghent, M.D.    Ct Head Wo Contrast  03/22/2012  *RADIOLOGY REPORT*  Clinical Data: Leg weakness.  CT HEAD WITHOUT CONTRAST  Technique:  Contiguous axial images were obtained from the base of the skull through the vertex without contrast.  Comparison: MRI of the brain performed 01/17/2008  Findings: There is no evidence of acute infarction, mass lesion, or intra- or extra-axial hemorrhage on CT.  Prominence of the ventricles and sulci reflects moderate cortical volume loss.  Mild cerebellar atrophy is noted.  Scattered periventricular and subcortical white matter change likely reflects small vessel ischemic microangiopathy.  Small chronic infarcts are noted at the right frontoparietal region.  The brainstem and fourth ventricle are within normal limits.  The basal ganglia are unremarkable in appearance.  No mass  effect or midline shift is seen.  There is no evidence of fracture; visualized osseous structures are unremarkable in appearance.  The orbits are within normal limits. The paranasal sinuses and mastoid air cells are well-aerated.  No significant soft tissue abnormalities are seen.  IMPRESSION:  1.  No acute intracranial pathology seen on CT. 2.  Moderate cortical volume loss and scattered small vessel ischemic microangiopathy. 3.  Small chronic infarcts at the right frontoparietal region.   Original Report Authenticated By: Tonia Ghent, M.D.    US Abdomen Complete  03/22/2012  *RADIOLOGY REPORT*  Clinical Data:  Abdominal pain.  Rule out gallstones  COMPLETE ABDOMINAL ULTRASOUND  Comparison:  None.  Findings:  Gallbladder:  Small amount of sludge in the gallbladder without definite gallstones.  Gallbladder wall is mildly thickened at 7 mm. Negative sonographic Murphy's sign.  Common bile duct:  4.0 mm  Liver:  Echogenic liver diffusely suggestive of fatty infiltration. No focal liver lesion.  IVC:  Not well seen  Pancreas:  No focal abnormality seen.  Spleen:  8.2 mm  Right Kidney:  9.8 cm.  Negative for obstruction or mass  Left Kidney:  11.3 cm.  Negative for obstruction or mass.  Abdominal aorta:  Negative for aneurysm.  IMPRESSION:  Gallbladder sludge and gallbladder wall thickening.  No definite stones.  This may be due to acalculous cholecystitis.  However the patient is not focally tender over the gallbladder.   Original Report Authenticated By: Janeece Riggers, M.D.       MDM  Iv ns. Labs.  Reviewed nursing notes and prior charts for additional history.    Date: 03/28/2012  Rate: 93  Rhythm: atrial fibrillation and premature ventricular contractions (PVC)  QRS Axis: normal  Intervals: normal  ST/T Wave abnormalities: nonspecific ST/T changes  Conduction Disutrbances:none  Narrative Interpretation:   Old EKG Reviewed: changes noted   Given new onset a fib, dyspnea w minimal exertion,  chf on lab/xr - will admit for possible anticoag, med management, possible card consult.   [facility] called to admit.  Lasix iv in ed.   [facility] states admit, team 8, tele        Suzi Roots, MD 03/28/12 1215  Suzi Roots, MD 03/28/12 [phone]

## 2012-03-28 NOTE — ED Notes (Signed)
Patient transported to X-ray 

## 2012-03-28 NOTE — ED Notes (Signed)
Pt up to bathroom with assistance. She became SOB on exertion, pt assisted back to bed without difficulty.

## 2012-03-28 NOTE — Progress Notes (Signed)
Pt having frequent PVC's and HR went up to 140's when getting up to [facility], pt asymptomatic, given dose of lopressor.  Notified Dr. Arthor Captain, given order to repeat EKG.  Will carry out orders and continue to monitor.

## 2012-03-28 NOTE — ED Notes (Signed)
CDU nurse to call when able to accept patient.

## 2012-03-29 LAB — PHOSPHORUS: Phosphorus: 4 mg/dL (ref 2.3–4.6)

## 2012-03-29 LAB — BASIC METABOLIC PANEL
BUN: 14 mg/dL (ref 6–23)
Chloride: 100 mEq/L (ref 96–112)
GFR calc Af Amer: 54 mL/min — ABNORMAL LOW (ref >90)
GFR calc non Af Amer: 47 mL/min — ABNORMAL LOW (ref >90)
Glucose, Bld: 122 mg/dL — ABNORMAL HIGH (ref 70–99)
Potassium: 3.8 mEq/L (ref 3.5–5.1)
Sodium: 140 mEq/L (ref 135–145)

## 2012-03-29 LAB — CBC
HCT: 44.8 % (ref 36.0–46.0)
Hemoglobin: 14.9 g/dL (ref 12.0–15.0)
MCH: 30.2 pg (ref 26.0–34.0)
MCHC: 33.3 g/dL (ref 30.0–36.0)
RBC: 4.94 MIL/uL (ref 3.87–5.11)

## 2012-03-29 LAB — MAGNESIUM: Magnesium: 2.1 mg/dL (ref 1.5–2.5)

## 2012-03-29 MED ORDER — DILTIAZEM HCL 30 MG PO TABS
30.0000 mg | ORAL_TABLET | Freq: Four times a day (QID) | ORAL | Status: DC
  Filled 2012-03-29 (×4): qty 1

## 2012-03-29 MED ORDER — METOPROLOL TARTRATE 12.5 MG HALF TABLET
12.5000 mg | ORAL_TABLET | Freq: Once | ORAL | Status: DC
  Filled 2012-03-29: qty 1

## 2012-03-29 MED ORDER — DIGOXIN 125 MCG PO TABS
0.1250 mg | ORAL_TABLET | Freq: Every day | ORAL | Status: DC
  Filled 2012-03-29: qty 1

## 2012-03-29 MED ORDER — METOPROLOL TARTRATE 50 MG PO TABS
50.0000 mg | ORAL_TABLET | Freq: Two times a day (BID) | ORAL | Status: DC
  Filled 2012-03-29: qty 1

## 2012-03-29 MED ORDER — METOPROLOL TARTRATE 25 MG PO TABS
25.0000 mg | ORAL_TABLET | Freq: Three times a day (TID) | ORAL | Status: DC
  Administered 2012-03-29 – 2012-03-30 (×3): 25 mg via ORAL
  Filled 2012-03-29 (×5): qty 1

## 2012-03-29 MED ORDER — RIVAROXABAN 20 MG PO TABS
20.0000 mg | ORAL_TABLET | Freq: Every day | ORAL | Status: DC
  Administered 2012-03-29: 20 mg via ORAL
  Filled 2012-03-29 (×2): qty 1

## 2012-03-29 NOTE — Progress Notes (Signed)
Held 1400 dose of digoxin per Dr Anne Fu. Dr Anne Fu will re-evaluate cardiac medication.  Juliane Lack, RN

## 2012-03-29 NOTE — Progress Notes (Signed)
Patient evaluated for community based chronic disease management services with [facility] Care Management Program as a benefit of patient's [employer].  Spoke with patient at bedside to explain [facility] Care Management services. Patient feels that she and her husband will be able to properly manage her care once discharged to home.   Left contact information and [facility] literature for her review. Of note, [facility] Care Management services does not replace or interfere with any services that are arranged by inpatient case management or social work.  For additional questions or referrals please contact Anibal Henderson BSN RN [facility] Liaison at [phone].

## 2012-03-29 NOTE — Consult Note (Signed)
Admit date: 03/28/2012 Referring Physician  Dr. Mahala Menghini Primary Physician Sissy Hoff, MD Primary Cardiologist  None Reason for Consultation  New AFIB, heart failure  HPI: 78 year old female with newly discovered atrial fibrillation with rapid ventricular response, heart failure, newly discovered ejection fraction of 30-35% with mild to moderate memory impairment, no prior cardiac history here for evaluation. She states that over the past several weeks, perhaps 3 or 4 she has been experiencing dyspnea on exertion, minimal exertion, NYHA class 2-3 symptoms. She denies any prior history of myocardial infarction or symptoms of angina. She has not had any history of syncope. No early family history of myocardial infarction. I spoke to her husband who confirmed her history.  After receiving Lasix, she feels much improved and appears quite comfortable in bed. As far as atrial fibrillation rate control, she is currently in the 70s. She has experienced a 2.8 second pause and has not received her first dose of digoxin/this has been canceled. She did experience a 12 beat run of wide-complex tachycardia, likely ventricular tachycardia earlier this evening that was asymptomatic. When asked about syncope, she denies that she does remember an incident in the shower approximately one month ago where she describes herself slumping to the floor due to leg weakness but not syncope. She was laying there until her husband arrived for quite some time. The water was cold by the time he arrived.  She states that she was supposed to travel to [place] over the holidays but this was canceled because of her current dyspnea. She does note that she has had a TIA in the past. She remembers seeing her face droop in the near or in she had difficulty with her speech. She states that this was worked up by Dr. Azucena Cecil and she was told that she had a TIA. She does not have diabetes.      PMH:   Past Medical History  Diagnosis  Date  . Hypertension   . TIA (transient ischemic attack)     PSH:   Past Surgical History  Procedure Date  . Knee surgery    Allergies:  Penicillins Prior to Admit Meds:   Prescriptions prior to admission  Medication Sig Dispense Refill  . ALPRAZolam (XANAX) 0.25 MG tablet Take 0.25 mg by mouth 3 (three) times daily as needed. For anxiety or dizziness      . aspirin EC 81 MG tablet Take 81 mg by mouth at bedtime.       Marland Kitchen atorvastatin (LIPITOR) 20 MG tablet Take 20 mg by mouth every morning.       Marland Kitchen CALCIUM PO Take 1 tablet by mouth daily.      . Cetirizine HCl (ZYRTEC PO) Take 1 tablet by mouth daily.      . citalopram (CELEXA) 20 MG tablet Take 20 mg by mouth every morning.       . Lansoprazole (PREVACID PO) Take 1 tablet by mouth at bedtime.      . Multiple Vitamin (MULTIVITAMIN WITH MINERALS) TABS Take 1 tablet by mouth daily.      . verapamil (CALAN-SR) 240 MG CR tablet Take 240 mg by mouth every morning.        Fam HX:    Family History  Problem Relation Age of Onset  . Diabetes Brother    Social HX:    History   Social History  . Marital Status: Married    Spouse Name: N/A    Number of Children: N/A  . Years of  Education: N/A   Occupational History  . Not on file.   Social History Main Topics  . Smoking status: Never Smoker   . Smokeless tobacco: Not on file  . Alcohol Use: Yes     Comment: daily  . Drug Use: No  . Sexually Active:    Other Topics Concern  . Not on file   Social History Narrative  . No narrative on file     ROS:  Denies any significant cough, fevers, rash, dysphasia. All 11 ROS were addressed and are negative except what is stated in the HPI  Physical Exam: Blood pressure 105/58, pulse 63, temperature 98.2 F (36.8 C), temperature source Oral, resp. rate 18, height 5\' 5"  (1.651 m), weight 88.769 kg (195 lb 11.2 oz), SpO2 96.00%.    General: Well developed, well nourished, in no acute distress Head: Eyes PERRLA, No xanthomas.    Normal cephalic and atramatic  Lungs:   Clear bilaterally to auscultation and percussion. Normal respiratory effort. No wheezes, no rales. Heart:  Irregularly irregular Pulses are 2+ & equal. Systolic murmur left lower sternal border            No carotid bruit. No JVD.  No abdominal bruits. Abdomen: Bowel sounds are positive, abdomen soft and non-tender without masses. No hepatosplenomegaly. Msk:  Back normal. Normal strength and tone for age. Extremities:   No clubbing, cyanosis. 1+ edema of lower extremities.  DP +1 Neuro: Alert and oriented but clearly has memory issues admittedly so., non-focal, MAE x 4 GU: Deferred Rectal: Deferred Psych:  Good affect, responds appropriately    Labs:   Lab Results  Component Value Date   WBC 9.6 03/29/2012   HGB 14.9 03/29/2012   HCT 44.8 03/29/2012   MCV 90.7 03/29/2012   PLT 266 03/29/2012    Lab 03/29/12 0915 03/28/12 0852  NA 140 --  K 3.8 --  CL 100 --  CO2 23 --  BUN 14 --  CREATININE 1.11* --  CALCIUM 9.7 --  PROT -- 6.7  BILITOT -- 0.5  ALKPHOS -- 152*  ALT -- 68*  AST -- 25  GLUCOSE 122* --   No results found for this basename: PTT   Lab Results  Component Value Date   INR 1.0 08/29/2007   Lab Results  Component Value Date   TROPONINI <0.30 03/29/2012        Radiology:  Dg Chest 2 View  03/28/2012  *RADIOLOGY REPORT*  Clinical Data: Shortness of breath  CHEST - 2 VIEW  Comparison:  03/22/2012  Findings: Mild cardiac enlargement.  There is a small right pleural effusion.  Mild interstitial edema noted.  Atelectasis identified in the right base.  IMPRESSION:  1.  Mild CHF.   Original Report Authenticated By: Signa Kell, M.D.    Personally viewed.  EKG:  Several EKGs demonstrate atrial fibrillation with rapid ventricular response, 1 EKG being 126 beats per minute. PVCs were noted. Approximately 15 beat run of ventricular tachycardia/wide-complex tachycardia was noted on telemetry. 2.8 second pause was reported by  nursing on telemetry. Personally viewed.   Echocardiogram:-Ejection fraction 30-35%.  ASSESSMENT/PLAN:    88 old female with newly discovered atrial fibrillation with rapid ventricular response, cardiomyopathy/left ventricular systolic dysfunction/acute systolic heart failure possibly tachycardia induced cannot exclude ischemic at this point with prior TIA, EF 35% and memory impairment.  1. Acute systolic heart failure-discussed case with Dr. Mahala Menghini. Agree with discontinuation of calcium channel blocker, verapamil, because of negative inotropic qualities. Agree with  trying to increase her beta blocker metoprolol 25 mg twice a day up to 25 mg 3 times a day to maintain rate control. Currently she is rate controlled. I have asked nursing to hold off on digoxin because of recent 2.8 second pause. We will try to maintain adequate rate control with metoprolol understanding that tachycardia/bradycardia syndrome may require pacemaker. Amiodarone would also be an option with cardioversion if absolutely necessary. Ultimately, we will convert to long-acting metoprolol because of heart failure indications. TSH is normal. Hemoglobin A1c is normal. ALT is slightly elevated at 68. Troponin is normal. BNP is elevated from 3015-8165. Creatinine is 1.1. This is slightly increased from 0.84. Continue to closely monitor. Magnesium is normal.   My current plan is to continue to up titrate medication, beta blocker, add ACE inhibitor if possible, continue with diuresis as this has helped her symptomatically. It is certainly plausible that she may have developed a tachycardia-induced cardiomyopathy and that adequate rate control will aid in improvement of ejection fraction. We can reassess her ejection fraction in the upcoming weeks and if not improved, we will then proceed with cardiac catheterization to exclude ischemic cardiomyopathy.  2. Ventricular tachycardia-nonsustained. Etiology likely from cardiomyopathy/decreased  ejection fraction. She has no syncopal history. With her cardiomyopathy, she is at increased risk for some cardiac death. We will continue to up titrate beta blocker.  3. Atrial fibrillation-rapid ventricular response-as above. She is at increased risk for stroke given her prior TIA, decreased ejection fraction, female. I will initiate anticoagulation with Xarelto 20 mg once a day. She may discontinue her aspirin.  I discussed plan with primary physician as well as husband.  Donato Schultz, MD  03/29/2012  8:27 PM

## 2012-03-29 NOTE — Progress Notes (Signed)
Pt had 17 beat run of Vtach. Pt asymptomatic. BP 96/55, P 70. Notified MD. Metoprolol ordered. Will continue to monitor pt closely and follow MD orders.  Juliane Lack, RN

## 2012-03-29 NOTE — Progress Notes (Signed)
  Echocardiogram 2D Echocardiogram has been performed.  Ellender Hose A 03/29/2012, 3:23 PM

## 2012-03-29 NOTE — Progress Notes (Signed)
PROGRESS NOTE  Heidi Miranda:[id number] DOB: 26-Feb-1933 DOA: 03/28/2012 PCP: Sissy Hoff, MD  Brief narrative: 78 yr old female admitted with progressive SOB, LE edema, and found to have new onset Afib  Past medical history-As per Problem list Chart reviewed as below-  Admission R Quadriceps Tear-s/p repair-h/o seizure   Admission 06/07/03 with bilat TKR  Consultants:  Cardiology consulted 12/30-[facility] at Patient's request  Procedures:  Chest x-ray showed mild CHF  Antibiotics:  None currently   Subjective  Patient feels well. She states shortness of breath is better. Swelling her legs is decreased She is eating and drinking. No nausea no vomiting No chest pain    Objective    Interim History: Noted slightly elevated heart rate  Telemetry: Atrial fibrillation with PVCs now rate controlled  Objective: Filed Vitals:   03/28/12 2215 03/29/12 0100 03/29/12 0415 03/29/12 1003  BP: 126/91 118/82 112/93 112/63  Pulse: 110 90 94   Temp:  97.6 F (36.4 C) 97.7 F (36.5 C)   TempSrc:  Oral Oral   Resp:  18 18   Height:      Weight:   88.769 kg (195 lb 11.2 oz)   SpO2:  96% 95%     Intake/Output Summary (Last 24 hours) at 03/29/12 1055 Last data filed at 03/29/12 1003  Gross per 24 hour  Intake    343 ml  Output   3512 ml  Net  -3169 ml    Exam:  General: Alert pleasant oriented Caucasian female looking younger than stated age Cardiovascular: S1-S2 regularly irregular rhythm no JVD or 3 Respiratory: Clinically clear Abdomen: Soft nontender nondistended Skin trace lower extremity edema Neuro grossly intact  Data Reviewed: Basic Metabolic Panel:  Lab 03/29/12 9562 03/28/12 0852  NA 140 142  K 3.8 3.6  CL 100 106  CO2 23 24  GLUCOSE 122* 89  BUN 14 11  CREATININE 1.11* 0.84  CALCIUM 9.7 9.2  MG -- --  PHOS -- --   Liver Function Tests:  Lab 03/28/12 0852  AST 25  ALT 68*  ALKPHOS 152*  BILITOT 0.5  PROT 6.7  ALBUMIN 3.2*   No  results found for this basename: LIPASE:5,AMYLASE:5 in the last 168 hours No results found for this basename: AMMONIA:5 in the last 168 hours CBC:  Lab 03/29/12 0915 03/28/12 0852  WBC 9.6 7.3  NEUTROABS -- --  HGB 14.9 12.7  HCT 44.8 38.8  MCV 90.7 91.3  PLT 266 212   Cardiac Enzymes:  Lab 03/29/12 0916 03/28/12 2102 03/28/12 1539 03/28/12 0852  CKTOTAL -- -- -- --  CKMB -- -- -- --  CKMBINDEX -- -- -- --  TROPONINI <0.30 <0.30 <0.30 <0.30   BNP: No components found with this basename: POCBNP:5 CBG: No results found for this basename: GLUCAP:5 in the last 168 hours  No results found for this or any previous visit (from the past 240 hour(s)).   Studies:              All Imaging reviewed and is as per above notation   Scheduled Meds:   . aspirin EC  81 mg Oral QHS  . atorvastatin  20 mg Oral q morning - 10a  . citalopram  20 mg Oral q morning - 10a  . furosemide  40 mg Intravenous Q12H  . heparin  5,000 Units Subcutaneous Q8H  . metoprolol tartrate  25 mg Oral BID  . multivitamin with minerals  1 tablet Oral Daily  .  pantoprazole  40 mg Oral Daily  . sodium chloride  3 mL Intravenous Q12H  . verapamil  240 mg Oral q morning - 10a   Continuous Infusions:    Assessment/Plan: 1. Decompensated heart failure-unclear if systolic or diastolic-continue Lasix 40 every 12 hourly, Toprol 5 mg every 6 when necessary. Continue metoprolol titrate 25 twice a day 2. New-onset atrial fibrillation [place] score 3-patient currently on aspirin 81-probably will need Coumadin. This is probably not valvular afebrile. 3. Hypertension-Continue Metoprolol 25 bid, Verapamil 240 CD 4. Reflux-continue pantoprazole 40 mg daily 5. Hyperlipidemia continue atorvastatin 20 mg every morning 6. Depression continue citalopram 20 mg by mouth every morning 7. Forgetfulness-potentially dementia. Consider outpatient M.  Code Status: Full Family Communication: None at bedside Disposition Plan:  Inpatient   Pleas Koch, MD  [facility] Pager [phone] 03/29/2012, 10:55 AM    LOS: 1 day

## 2012-03-29 NOTE — Progress Notes (Signed)
Pt had 2.81 second pause. MD notified. Pt asymptomatic. Will continue to monitor pt closely and follow MD order.  Juliane Lack, RN

## 2012-03-29 NOTE — Progress Notes (Signed)
Brief Nutrition Note:  RD attempted to educate pt x3, pt unavailable and no family in room at time of visits. RD left materials at bedside for pt to review as able. RD will follow up as able to provide additional education.   Clarene Duke RD, LDN Pager [phone] After Hours pager [phone]

## 2012-03-30 LAB — BASIC METABOLIC PANEL
CO2: 28 mEq/L (ref 19–32)
Chloride: 99 mEq/L (ref 96–112)
GFR calc Af Amer: 50 mL/min — ABNORMAL LOW (ref >90)
Potassium: 4.7 mEq/L (ref 3.5–5.1)

## 2012-03-30 MED ORDER — FUROSEMIDE 40 MG PO TABS
40.0000 mg | ORAL_TABLET | Freq: Two times a day (BID) | ORAL | Status: DC
  Administered 2012-03-30: 40 mg via ORAL
  Filled 2012-03-30 (×4): qty 1

## 2012-03-30 MED ORDER — METOPROLOL TARTRATE 50 MG PO TABS
50.0000 mg | ORAL_TABLET | Freq: Two times a day (BID) | ORAL | Status: DC
  Administered 2012-03-30 – 2012-03-31 (×2): 50 mg via ORAL
  Filled 2012-03-30 (×3): qty 1

## 2012-03-30 MED ORDER — RIVAROXABAN 15 MG PO TABS
15.0000 mg | ORAL_TABLET | Freq: Every day | ORAL | Status: DC
  Administered 2012-03-30: 15 mg via ORAL
  Filled 2012-03-30 (×2): qty 1

## 2012-03-30 NOTE — Plan of Care (Signed)
Problem: Food- and Nutrition-Related Knowledge Deficit (NB-1.1) Goal: Nutrition education Formal process to instruct or train a patient/client in a skill or to impart knowledge to help patients/clients voluntarily manage or modify food choices and eating behavior to maintain or improve health.  Outcome: Completed/Met Date Met:  03/30/12 Nutrition Education Note  RD consulted for nutrition education regarding CHF.  RD provided "Low Sodium Nutrition Therapy" handout from the [employer]. Reviewed patient's dietary recall. Provided examples on ways to decrease sodium intake in diet. Discouraged intake of processed foods and use of salt shaker. Encouraged fresh fruits and vegetables as well as whole grain sources of carbohydrates to maximize fiber intake.   RD discussed why it is important for patient to adhere to diet recommendations, and emphasized the role of fluids, foods to avoid, and importance of weighing self daily. Teach back method used.  Expect fair compliance. Pt husband does the cooking, he was not available at time of education. Encouraged pt to share information with him.   Body mass index is 32.37 kg/(m^2). Pt meets criteria for obesity class 1 based on current BMI.  Current diet order is Low Sodium, patient is consuming approximately 50-100% of meals at this time. Labs and medications reviewed. No further nutrition interventions warranted at this time. RD contact information provided. If additional nutrition issues arise, please re-consult RD.   Clarene Duke RD, LDN Pager [phone] After Hours pager [phone]

## 2012-03-30 NOTE — Progress Notes (Signed)
UR Chart Review Completed  

## 2012-03-30 NOTE — Progress Notes (Signed)
Subjective:  Sitting up in bed, slightly runny nose, smiling, looks improved. No SOB. Will try to ambulate today.   Objective:  Vital Signs in the last 24 hours: Temp:  [97.1 F (36.2 C)-98.2 F (36.8 C)] 98 F (36.7 C) (12/31 0634) Pulse Rate:  [58-70] 58  (12/31 0634) Resp:  [18] 18  (12/31 0634) BP: (96-117)/(55-67) 117/58 mmHg (12/31 0634) SpO2:  [96 %-99 %] 99 % (12/31 0634) Weight:  [88.225 kg (194 lb 8 oz)] 88.225 kg (194 lb 8 oz) (12/31 1610)  Intake/Output from previous day: 12/30 0701 - 12/31 0700 In: 697 [P.O.:682; I.V.:3; IV Piggyback:12] Out: 1120 [Urine:1120]   Physical Exam: General: Well developed, well nourished, in no acute distress. Head:  Normocephalic and atraumatic. Lungs: Clear to auscultation and percussion. Heart: Irreg irreg, normal rate.  Soft S murmur, rubs or gallops. No JVD Abdomen: soft, non-tender, positive bowel sounds. Extremities: No clubbing or cyanosis. No edema. Neurologic: Alert and oriented x 3.    Lab Results:  Basename 03/29/12 0915 03/28/12 0852  WBC 9.6 7.3  HGB 14.9 12.7  PLT 266 212    Basename 03/29/12 0915 03/28/12 0852  NA 140 142  K 3.8 3.6  CL 100 106  CO2 23 24  GLUCOSE 122* 89  BUN 14 11  CREATININE 1.11* 0.84    Basename 03/29/12 0916 03/28/12 2102  TROPONINI <0.30 <0.30   Hepatic Function Panel  Basename 03/28/12 0852  PROT 6.7  ALBUMIN 3.2*  AST 25  ALT 68*  ALKPHOS 152*  BILITOT 0.5  BILIDIR --  IBILI --  Imaging: Dg Chest 2 View  03/28/2012  *RADIOLOGY REPORT*  Clinical Data: Shortness of breath  CHEST - 2 VIEW  Comparison:  03/22/2012  Findings: Mild cardiac enlargement.  There is a small right pleural effusion.  Mild interstitial edema noted.  Atelectasis identified in the right base.  IMPRESSION:  1.  Mild CHF.   Original Report Authenticated By: Signa Kell, M.D.    Personally viewed.   Telemetry: Yesterday 15 beats of NSVT. No further, no pauses. Good rate control of afib.   Personally viewed.   Cardiac Studies:  EF 35%  Assessment/Plan:  Principal Problem:  *Acute CHF Active Problems:  Atrial fibrillation  SOB (shortness of breath)  HTN (hypertension)  Forgetfulness  1. Acute systolic HF - agree with continued lasix IV. Doing well. Feels better. Titrating Bb. Consolidate to long acting once stable. Will add ACE-I if able in the future (BP has been low)  2. AFIB - well rate controlled. Bb. Off Verapamil (EF 35%).   3. Chronic anticoagulation - started Xarelto 12/30. Monitor for bleeding. Off ASA.   Will follow.    SKAINS, MARK 03/30/2012, 9:05 AM

## 2012-03-30 NOTE — Progress Notes (Signed)
Followed up with patient regarding desire to participate with services at this time.  She feels that she and her husband can manage her care with home health at discharge.  She will contact her PCP to access [facility] services as needed.

## 2012-03-30 NOTE — Progress Notes (Addendum)
PROGRESS NOTE  Heidi Miranda XBJ:[id number] DOB: 05-Nov-1932 DOA: 03/28/2012 PCP: Sissy Hoff, MD  Brief narrative: 78 yr old female admitted with progressive SOB, LE edema, and found to have new onset Afib-she had episodes of Tachycardia and 2.98 second pauses, which prompted cardiology consultations.  Past medical history-As per Problem list Chart reviewed as below-  Admission R Quadriceps Tear-s/p repair-h/o seizure   Admission 06/07/03 with bilat TKR  Consultants:  Cardiology consulted 12/30-Dr. Anne Fu  Procedures:  Chest x-ray showed mild CHF  Echo 12.30=severe systolic/grade 3 diastolic dysfunction-ef 30-35%  Antibiotics:  None currently   Subjective  Patient feels well. She states shortness of breath is better. No palpitations. No CP/No N V/SOb    Objective    Interim History: none  Telemetry: Atrial fibrillation with PVCs now rate controlled  Objective: Filed Vitals:   03/29/12 1756 03/29/12 2112 03/30/12 0634 03/30/12 1034  BP: 105/58 108/64 117/58 120/74  Pulse: 63  58 76  Temp: 98.2 F (36.8 C) 97.4 F (36.3 C) 98 F (36.7 C)   TempSrc: Oral Oral Oral   Resp:  18 18   Height:      Weight:   88.225 kg (194 lb 8 oz)   SpO2: 96% 99% 99%     Intake/Output Summary (Last 24 hours) at 03/30/12 1340 Last data filed at 03/30/12 1312  Gross per 24 hour  Intake    834 ml  Output   1195 ml  Net   -361 ml    Exam:  General: Alert pleasant oriented Caucasian female looking younger than stated age Cardiovascular: S1-S2 regularly irregular rhythm no JVD  Respiratory: Clinically clear Abdomen: Soft nontender nondistended Skin trace lower extremity edema Neuro grossly intact  Data Reviewed: Basic Metabolic Panel:  Lab 03/30/12 3086 03/29/12 1708 03/29/12 0915 03/28/12 0852  NA 139 -- 140 142  K 4.7 -- 3.8 --  CL 99 -- 100 106  CO2 28 -- 23 24  GLUCOSE 115* -- 122* 89  BUN 21 -- 14 11  CREATININE 1.18* -- 1.11* 0.84  CALCIUM 10.0 -- 9.7  9.2  MG -- 2.1 -- --  PHOS -- 4.0 -- --   Liver Function Tests:  Lab 03/28/12 0852  AST 25  ALT 68*  ALKPHOS 152*  BILITOT 0.5  PROT 6.7  ALBUMIN 3.2*   No results found for this basename: LIPASE:5,AMYLASE:5 in the last 168 hours No results found for this basename: AMMONIA:5 in the last 168 hours CBC:  Lab 03/29/12 0915 03/28/12 0852  WBC 9.6 7.3  NEUTROABS -- --  HGB 14.9 12.7  HCT 44.8 38.8  MCV 90.7 91.3  PLT 266 212   Cardiac Enzymes:  Lab 03/29/12 0916 03/28/12 2102 03/28/12 1539 03/28/12 0852  CKTOTAL -- -- -- --  CKMB -- -- -- --  CKMBINDEX -- -- -- --  TROPONINI <0.30 <0.30 <0.30 <0.30   BNP: No components found with this basename: POCBNP:5 CBG: No results found for this basename: GLUCAP:5 in the last 168 hours  No results found for this or any previous visit (from the past 240 hour(s)).   Studies:              All Imaging reviewed and is as per above notation   Scheduled Meds:    . atorvastatin  20 mg Oral q morning - 10a  . citalopram  20 mg Oral q morning - 10a  . furosemide  40 mg Intravenous Q12H  . metoprolol tartrate  25 mg  Oral TID  . multivitamin with minerals  1 tablet Oral Daily  . pantoprazole  40 mg Oral Daily  . rivaroxaban  15 mg Oral Q supper  . sodium chloride  3 mL Intravenous Q12H   Continuous Infusions:    Assessment/Plan: 1. Severe acute combined decompensated heart failure ef 30-35% [?2/2 to Afib]--continue Lasix 40 every 12 hourly-potential, change to PO 12.31 pm, Continue metoprolol titrate to 50 bid c activity.  Patient -3.5 liters, lost 1.3 lbs 2. New-onset atrial fibrillation [place] score 3-patient started on Xarelto 15qd.  Periodic CBC 3. Hypertension-Continue Metoprolol 50 bid-Consider adding Lisinopril 2.5 daily if Blood pressure can support on 1.1.14 4. Reflux-continue pantoprazole 40 mg daily 5. Hyperlipidemia continue atorvastatin 20 mg every morning 6. Depression continue citalopram 20 mg by mouth every  morning 7. Forgetfulness-potentially dementia. Consider outpatient follow up  Code Status: Full Family Communication: None at bedside-tried to call no answer Disposition Plan: likely d/c in 1-2 days if good uop and rate controlled without hypotnesion   Pleas Koch, MD  [facility] Pager [phone] 03/30/2012, 1:40 PM    LOS: 2 days

## 2012-03-31 DIAGNOSIS — I5021 Acute systolic (congestive) heart failure: Secondary | ICD-10-CM

## 2012-03-31 LAB — CBC
HCT: 41 % (ref 36.0–46.0)
Hemoglobin: 13.9 g/dL (ref 12.0–15.0)
MCH: 30.5 pg (ref 26.0–34.0)
MCV: 90.1 fL (ref 78.0–100.0)
RBC: 4.55 MIL/uL (ref 3.87–5.11)
WBC: 10.1 10*3/uL (ref 4.0–10.5)

## 2012-03-31 LAB — BASIC METABOLIC PANEL
CO2: 28 mEq/L (ref 19–32)
Chloride: 100 mEq/L (ref 96–112)
Glucose, Bld: 102 mg/dL — ABNORMAL HIGH (ref 70–99)
Sodium: 140 mEq/L (ref 135–145)

## 2012-03-31 MED ORDER — RIVAROXABAN 15 MG PO TABS: 15.0000 mg | ORAL_TABLET | Freq: Every day | ORAL | Status: DC

## 2012-03-31 MED ORDER — RIVAROXABAN 20 MG PO TABS: 20.0000 mg | ORAL_TABLET | Freq: Every day | ORAL | Status: DC

## 2012-03-31 MED ORDER — LISINOPRIL 2.5 MG PO TABS
2.5000 mg | ORAL_TABLET | Freq: Every day | ORAL | Status: DC
  Administered 2012-03-31: 2.5 mg via ORAL
  Filled 2012-03-31: qty 1

## 2012-03-31 MED ORDER — METOPROLOL TARTRATE 50 MG PO TABS: 50.0000 mg | ORAL_TABLET | Freq: Two times a day (BID) | ORAL | Status: DC

## 2012-03-31 MED ORDER — FUROSEMIDE 40 MG PO TABS
40.0000 mg | ORAL_TABLET | Freq: Every day | ORAL | Status: DC
  Administered 2012-03-31: 40 mg via ORAL
  Filled 2012-03-31: qty 1

## 2012-03-31 NOTE — Plan of Care (Signed)
Problem: Phase I Progression Outcomes Goal: EF % per last Echo/documented,Core Reminder form on chart Outcome: Completed/Met Date Met:  03/31/12 EF 30-35% from 03/29/12

## 2012-03-31 NOTE — Evaluation (Signed)
Physical Therapy Evaluation Patient Details Name: Heidi Miranda MRN: [id number] DOB: [birth year] Today's Date: 03/31/2012 Time: 1340-1406 PT Time Calculation (min): 26 min  PT Assessment / Plan / Recommendation Clinical Impression  Heidi Miranda is 79 y/o female admitted with SOB in the setting of a-fib with RVR. Presents to PT today with below impairments affecting her safety and independence with mobility. Will benefit physical therapy in the acute setting to address these and maximize safety for d/c home. Pt instructed in use of RW for improved safety and balance at home during gait. I would also recommend 3in1 and pt instructed in putting  it by her bed at night to decrease her risk of falls.    PT Assessment  Patient needs continued PT services    Follow Up Recommendations  Home health PT;Supervision - Intermittent    Does the patient have the potential to tolerate intense rehabilitation      Barriers to Discharge Decreased caregiver support husband works and she reports he is an alcoholic, doesn't sound like a very pleasant living situation or that she can rely on her husband    Equipment Recommendations  3in1    Recommendations for Other Services     Frequency Min 3X/week    Precautions / Restrictions Precautions Precautions: Fall Restrictions Weight Bearing Restrictions: No     Mobility  Bed Mobility Bed Mobility: Supine to Sit;Sit to Supine Supine to Sit: 6: Modified independent (Device/Increase time);HOB elevated (30 degrees) Sit to Supine: 6: Modified independent (Device/Increase time);HOB elevated;With rail (HOB 30 degrees) Transfers Transfers: Sit to Stand;Stand to Sit Sit to Stand: 5: Supervision;With upper extremity assist Stand to Sit: 5: Supervision;With upper extremity assist Details for Transfer Assistance: supervision for safety given weakness and slight instability needing upper extremity assist  Ambulation/Gait Ambulation/Gait Assistance: 5:  Supervision Ambulation Distance (Feet): 100 Feet Assistive device: Rolling walker Ambulation/Gait Assistance Details: cues for tall posture and safe technique with RW  Gait Pattern: Step-through pattern;Shuffle General Gait Details: tries to take her hand off the walker to hold onto the rail (reports she'd rather hold onto the rail) but with education pt agreeable to RW as she doesn't have this wall railing at home; after education pt kept both hands on the RW and stopped appropriately anytime she needed to take a hand off the walker              PT Diagnosis: Difficulty walking;Abnormality of gait;Generalized weakness  PT Problem List: Decreased strength;Decreased activity tolerance;Decreased balance;Decreased knowledge of use of DME PT Treatment Interventions: DME instruction;Gait training;Functional mobility training;Therapeutic activities;Therapeutic exercise;Patient/family education;Balance training;Neuromuscular re-education   PT Goals Acute Rehab PT Goals PT Goal Formulation: With patient Time For Goal Achievement: 04/07/12 Potential to Achieve Goals: Good Pt will go Sit to Stand: with modified independence PT Goal: Sit to Stand - Progress: Goal set today Pt will go Stand to Sit: with modified independence PT Goal: Stand to Sit - Progress: Goal set today Pt will Transfer Bed to Chair/Chair to Bed: with modified independence PT Transfer Goal: Bed to Chair/Chair to Bed - Progress: Goal set today Pt will Ambulate: >150 feet;with modified independence;with least restrictive assistive device PT Goal: Ambulate - Progress: Goal set today  Visit Information  Last PT Received On: 03/31/12 Assistance Needed: +1    Subjective Data  Subjective: My husband is an alcoholic.  Patient Stated Goal: home today   Prior Functioning  Home Living Lives With: Spouse Available Help at Discharge: Family;Available PRN/intermittently Type of Home: House Home  Access: Level entry Home Layout:  One level Bathroom Shower/Tub: Health visitor: Standard Home Adaptive Equipment: Environmental consultant - rolling Prior Function Level of Independence: Independent Able to Take Stairs?: Yes Vocation: Retired Musician: No difficulties    Cognition  Overall Cognitive Status: Appears within functional limits for tasks assessed/performed Arousal/Alertness: Awake/alert Orientation Level: Appears intact for tasks assessed Behavior During Session: [facility] for tasks performed    Extremity/Trunk Assessment Right Upper Extremity Assessment RUE ROM/Strength/Tone: [facility] for tasks assessed Left Upper Extremity Assessment LUE ROM/Strength/Tone: WFL for tasks assessed Right Lower Extremity Assessment RLE ROM/Strength/Tone: Deficits RLE ROM/Strength/Tone Deficits: generalized weakness, grossly 4/5 Left Lower Extremity Assessment LLE ROM/Strength/Tone: Deficits LLE ROM/Strength/Tone Deficits: generalized weakness, grossly 4/5   Balance Balance Balance Assessed: Yes Static Standing Balance Static Standing - Balance Support: Right upper extremity supported Static Standing - Level of Assistance: 5: Stand by assistance  End of Session PT - End of Session Equipment Utilized During Treatment: Gait belt Activity Tolerance: Patient tolerated treatment well Patient left: Other (comment);with call bell/phone within reach (on the 3in1 with nurse tech present) Nurse Communication: Mobility status  GP     [facility] HELEN 03/31/2012, 2:35 PM

## 2012-03-31 NOTE — Discharge Summary (Signed)
[facility]                                                                                   Heidi Miranda, is a 79 y.o. female  DOB 11-17-1932  MRN [id number].  Admission date:  03/28/2012  Discharge Date:  03/31/2012  Primary MD  Sissy Hoff, MD  Admitting Physician  Clydia Llano, MD  Admission Diagnosis  Atrial fibrillation [427.31] CHF (congestive heart failure) [428.0] Forgetfulness [780.99] New onset a-fib [427.31] Acute CHF [428.0] New onset atrial fibrillation  Discharge Diagnosis     Principal Problem:  *Acute CHF Active Problems:  Atrial fibrillation  SOB (shortness of breath)  HTN (hypertension)  Forgetfulness      Past Medical History  Diagnosis Date  . Hypertension   . TIA (transient ischemic attack)     Past Surgical History  Procedure Date  . Knee surgery      Recommendations for primary care physician for things to follow:   Please follow BMP and patient's weight closely, if range of motion remains stable initiate low-dose ACE inhibitor, patient may need scheduled Lasix if she is retaining more fluid.   Discharge Diagnoses:    Discharge Condition: Stable   Diet recommendation: See Discharge Instructions below   Consults Dr. Loraine Leriche skains cardiology   History of present illness and  Hospital Course:  See H&P, Labs, Consult and Test reports for all details in brief, 79 year old female admitted for progressive shortness of breath and lower extremity edema, has history of atrial fibrillation and possible tachybradycardia syndrome, she was seen by cardiologist Dr. Donato Schultz, she underwent an echogram showing EF of 30% along with stage III diastolic dysfunction, she was treated with beta blockers, Lasix and fluid restriction with good results, she is currently compensated with no edema, no rales, she is am electing on room air without any discomfort.  She has been switched to beta blocker from calcium channel blocker due to  her systolic CHF, she has been placed on xaralto 15 mg daily after discussions with the pharmacist based on a creatinine clearance for atrial fibrillation by Dr. Donato Schultz, her aspirin has been stopped, she has been given written instructions on weight monitoring and fluid restriction, as her creatinine is rising I am not starting her on any scheduled ACE/ARB or scheduled Lasix, she is currently compensated, if her creatinine remains stable low-dose ACE inhibitor can be initiated, will crest primary care physician and her primary cardiologist to kindly monitor patient's BMP and weight closely.    For history of depression, reflux, dyslipidemia she will continue her home medications. Will defer to PCP to kindly monitor her chronic problems.    Today   Subjective:   [name] today has no headache,no chest abdominal pain,no new weakness tingling or numbness, feels much better. Related in the hallway without any discomfort and on room air.  Objective:   Blood pressure 128/77, pulse 87, temperature 97.6 F (36.4 C), temperature source Oral, resp. rate 17, height 5\' 5"  (1.651 m), weight 87.59 kg (193 lb 1.6 oz), SpO2 96.00%.   Intake/Output Summary (Last 24 hours) at 03/31/12 1210 Last data filed at 03/31/12 1100  Gross per 24 hour  Intake   1063 ml  Output   1176 ml  Net   -113 ml    Exam Awake Alert, Oriented *3, No new F.N deficits, Normal affect [place].AT,PERRAL Supple Neck,No JVD, No cervical lymphadenopathy appriciated.  Symmetrical Chest wall movement, Good air movement bilaterally, CTAB RRR,No Gallops,Rubs or new Murmurs, No Parasternal Heave +ve B.Sounds, Abd Soft, Non tender, No organomegaly appriciated, No rebound -guarding or rigidity. No Cyanosis, Clubbing or edema, No new Rash or bruise  Data Review   Major procedures and Radiology Reports - PLEASE review detailed and final reports for all details in brief -       Dg Chest 2 View  03/28/2012  *RADIOLOGY REPORT*   Clinical Data: Shortness of breath  CHEST - 2 VIEW  Comparison:  03/22/2012  Findings: Mild cardiac enlargement.  There is a small right pleural effusion.  Mild interstitial edema noted.  Atelectasis identified in the right base.  IMPRESSION:  1.  Mild CHF.   Original Report Authenticated By: Signa Kell, M.D.    Dg Chest 2 View  03/22/2012  *RADIOLOGY REPORT*  Clinical Data: Status post fall; shortness of breath.  CHEST - 2 VIEW  Comparison: Chest radiograph performed 08/28/2007  Findings: The lungs are well-aerated.  Mild vascular congestion is noted.  Mild bibasilar airspace opacities likely reflect atelectasis.  There is no evidence of pleural effusion or pneumothorax.  The heart is mildly enlarged.  No acute osseous abnormalities are seen.  IMPRESSION: Mild bibasilar airspace opacities likely reflect atelectasis; mild vascular congestion and cardiomegaly.  No displaced rib fractures seen.   Original Report Authenticated By: Tonia Ghent, M.D.    Ct Head Wo Contrast  03/22/2012  *RADIOLOGY REPORT*  Clinical Data: Leg weakness.  CT HEAD WITHOUT CONTRAST  Technique:  Contiguous axial images were obtained from the base of the skull through the vertex without contrast.  Comparison: MRI of the brain performed 01/17/2008  Findings: There is no evidence of acute infarction, mass lesion, or intra- or extra-axial hemorrhage on CT.  Prominence of the ventricles and sulci reflects moderate cortical volume loss.  Mild cerebellar atrophy is noted.  Scattered periventricular and subcortical white matter change likely reflects small vessel ischemic microangiopathy.  Small chronic infarcts are noted at the right frontoparietal region.  The brainstem and fourth ventricle are within normal limits.  The basal ganglia are unremarkable in appearance.  No mass effect or midline shift is seen.  There is no evidence of fracture; visualized osseous structures are unremarkable in appearance.  The orbits are within normal  limits. The paranasal sinuses and mastoid air cells are well-aerated.  No significant soft tissue abnormalities are seen.  IMPRESSION:  1.  No acute intracranial pathology seen on CT. 2.  Moderate cortical volume loss and scattered small vessel ischemic microangiopathy. 3.  Small chronic infarcts at the right frontoparietal region.   Original Report Authenticated By: Tonia Ghent, M.D.    US Abdomen Complete  03/22/2012  *RADIOLOGY REPORT*  Clinical Data:  Abdominal pain.  Rule out gallstones  COMPLETE ABDOMINAL ULTRASOUND  Comparison:  None.  Findings:  Gallbladder:  Small amount of sludge in the gallbladder without definite gallstones.  Gallbladder wall is mildly thickened at 7 mm. Negative sonographic Murphy's sign.  Common bile duct:  4.0 mm  Liver:  Echogenic liver diffusely suggestive of fatty infiltration. No focal liver lesion.  IVC:  Not well seen  Pancreas:  No focal abnormality seen.  Spleen:  8.2 mm  Right Kidney:  9.8 cm.  Negative for obstruction or mass  Left Kidney:  11.3 cm.  Negative for obstruction or mass.  Abdominal aorta:  Negative for aneurysm.  IMPRESSION:  Gallbladder sludge and gallbladder wall thickening.  No definite stones.  This may be due to acalculous cholecystitis.  However the patient is not focally tender over the gallbladder.   Original Report Authenticated By: Janeece Riggers, M.D.     Micro Results     CBC w Diff: Lab Results  Component Value Date   WBC 10.1 03/31/2012   HGB 13.9 03/31/2012   HCT 41.0 03/31/2012   PLT 287 03/31/2012   LYMPHOPCT 13 03/22/2012   MONOPCT 5 03/22/2012   EOSPCT 1 03/22/2012   BASOPCT 0 03/22/2012    CMP: Lab Results  Component Value Date   NA 140 03/31/2012   K 4.6 03/31/2012   CL 100 03/31/2012   CO2 28 03/31/2012   BUN 25* 03/31/2012   CREATININE 1.22* 03/31/2012   PROT 6.7 03/28/2012   ALBUMIN 3.2* 03/28/2012   BILITOT 0.5 03/28/2012   ALKPHOS 152* 03/28/2012   AST 25 03/28/2012   ALT 68* 03/28/2012  .   Discharge Instructions       Follow with Primary MD Sissy Hoff, MD in 3 days   Get CBC, CMP, checked 3 days by Primary MD and again as instructed by your Primary MD. Get a 2 view Chest X ray done next visit.  Get Medicines reviewed and adjusted.  Please request your Prim.MD to go over all Hospital Tests and Procedure/Radiological results at the follow up, please get all Hospital records sent to your Prim MD by signing hospital release before you go home.  Activity: As tolerated with Full fall precautions use walker/cane & assistance as needed   Diet:  Heart healthy,  Fluid restriction 1.8 lit/day, Aspiration precautions.  Check your Weight same time everyday, if you gain over 2 pounds, or you develop in leg swelling, experience more shortness of breath or chest pain, call your Primary MD immediately. Follow Cardiac Low Salt Diet and 1.8 lit/day fluid restriction.  Disposition Home   If you experience worsening of your admission symptoms, develop shortness of breath, life threatening emergency, suicidal or homicidal thoughts you must seek medical attention immediately by calling 911 or calling your MD immediately  if symptoms less severe.  You Must read complete instructions/literature along with all the possible adverse reactions/side effects for all the Medicines you take and that have been prescribed to you. Take any new Medicines after you have completely understood and accpet all the possible adverse reactions/side effects.   Do not drive and provide baby sitting services if your were admitted for syncope or siezures until you have seen by Primary MD or a Neurologist and advised to do so again.  Do not drive when taking Pain medications.    Do not take more than prescribed Pain, Sleep and Anxiety Medications  Special Instructions: If you have smoked or chewed Tobacco  in the last 2 yrs please stop smoking, stop any regular Alcohol  and or any Recreational drug use.  Wear Seat belts while  driving.  Follow-up Information    Follow up with Sissy Hoff, MD. Schedule an appointment as soon as possible for a visit in 3 days.   Contact information:   [address] [place] [address] [phone]       Follow up with Donato Schultz, MD. Schedule an appointment as soon as possible  for a visit in 1 week.   Contact information:   [address] [place] [address] [phone]            Discharge Medications     Medication List     As of 03/31/2012 12:10 PM    START taking these medications         metoprolol 50 MG tablet   Commonly known as: LOPRESSOR   Take 1 tablet (50 mg total) by mouth 2 (two) times daily.      Rivaroxaban 15 MG Tabs tablet   Commonly known as: XARELTO   Take 1 tablet (15 mg total) by mouth daily with supper.      CONTINUE taking these medications         ALPRAZolam 0.25 MG tablet   Commonly known as: XANAX      atorvastatin 20 MG tablet   Commonly known as: LIPITOR      CALCIUM PO      citalopram 20 MG tablet   Commonly known as: CELEXA      multivitamin with minerals Tabs      PREVACID PO      ZYRTEC PO      STOP taking these medications         aspirin EC 81 MG tablet      verapamil 240 MG CR tablet   Commonly known as: CALAN-SR          Where to get your medications    These are the prescriptions that you need to pick up. We sent them to a specific pharmacy, so you will need to go there to get them.   [id number] - [place], [place] - [address]    [address] [place] [address]    Phone: [phone]        metoprolol 50 MG tablet   Rivaroxaban 15 MG Tabs tablet               Total Time in preparing paper work, data evaluation and todays exam - 35 minutes  Leroy Sea M.D on 03/31/2012 at 12:10 PM  [facility] Office  [phone]

## 2012-03-31 NOTE — Progress Notes (Signed)
Patient Name: Heidi Miranda Date of Encounter: 03/31/2012    SUBJECTIVE: This is a pleasant female. She slept well last night for the first time in quite a while. She denies chest pain. Breathing is significantly improved.  TELEMETRY: Atrial fibrillation with controlled ventricular rate Filed Vitals:   03/30/12 1729 03/30/12 2021 03/31/12 0426 03/31/12 0914  BP: 119/69 110/71 124/71 128/77  Pulse: 85 84 76 87  Temp:  97.8 F (36.6 C) 97.6 F (36.4 C)   TempSrc:  Oral Oral   Resp:  18 17   Height:      Weight:   87.59 kg (193 lb 1.6 oz)   SpO2:  97% 96%     Intake/Output Summary (Last 24 hours) at 03/31/12 1042 Last data filed at 03/31/12 0900  Gross per 24 hour  Intake   1063 ml  Output   1075 ml  Net    -12 ml   I./O. net since admission: - 3462 cc LABS: Basic Metabolic Panel:  Basename 03/31/12 0520 03/30/12 0811 03/29/12 1708  NA 140 139 --  K 4.6 4.7 --  CL 100 99 --  CO2 28 28 --  GLUCOSE 102* 115* --  BUN 25* 21 --  CREATININE 1.22* 1.18* --  CALCIUM 9.3 10.0 --  MG -- -- 2.1  PHOS -- -- 4.0   CBC:  Basename 03/31/12 0520 03/29/12 0915  WBC 10.1 9.6  NEUTROABS -- --  HGB 13.9 14.9  HCT 41.0 44.8  MCV 90.1 90.7  PLT 287 266   Cardiac Enzymes:  Basename 03/29/12 0916 03/28/12 2102 03/28/12 1539  CKTOTAL -- -- --  CKMB -- -- --  CKMBINDEX -- -- --  TROPONINI <0.30 <0.30 <0.30  Hemoglobin A1C:  Basename 03/28/12 1539  HGBA1C 5.6   Radiology/Studies:  Clinical Data: Shortness of breath  CHEST - 2 VIEW  Comparison: 03/22/2012  Findings: Mild cardiac enlargement. There is a small right pleural  effusion. Mild interstitial edema noted. Atelectasis identified  in the right base.  IMPRESSION:  1. Mild CHF.  Original Report Authenticated By: Signa Kell, M.D.    Physical Exam: Blood pressure 128/77, pulse 87, temperature 97.6 F (36.4 C), temperature source Oral, resp. rate 17, height 5\' 5"  (1.651 m), weight 87.59 kg (193 lb 1.6 oz), SpO2  96.00%. Weight change: -0.635 kg (-1 lb 6.4 oz)   Irregular rhythm. No obvious gallop.  No obvious neck vein distention.  Lungs are clear posteriorly.  No peripheral edema  ASSESSMENT:  1. Acute combined systolic and diastolic heart failure, improving with medical therapy. Atrial fibrillation is the likely precipitant of acute decompensation and also possibly the cause for the decrease in LVEF.  2. Atrial fibrillation of unknown duration, now with rate control, and with anticoagulation and Xarelto  3. Hypertension  4. History of depression  Plan:  1. Switch diuretic therapy to oral dosing  2. Add ACE inhibitor  3. Ambulate and consider discharge in the next 24-36 hours if medications appear to be appropriately adjusted  4. Will need followup with cardiology, Dr. Donato Schultz  Signed, Lesleigh Noe 03/31/2012, 10:42 AM

## 2012-04-02 NOTE — Care Management Note (Signed)
    Page 1 of 1   04/02/2012     7:35:19 AM   CARE MANAGEMENT NOTE 04/02/2012  Patient:  GENESIS, NOVOSAD   Account Number:  [id number]  Date Initiated:  04/02/2012  Documentation initiated by:  Legacie Dillingham  Subjective/Objective Assessment:     Action/Plan:   Anticipated DC Date:     Anticipated DC Plan:           Choice offered to / List presented to:          [facility] arranged  HH-2 PT      [facility] agency  [facility]   Status of service:  Completed, signed off Medicare Important Message given?   (If response is "NO", the following Medicare IM given date fields will be blank) Date Medicare IM given:   Date Additional Medicare IM given:    Discharge Disposition:  HOME W HOME HEALTH SERVICES  Per UR Regulation:    If discussed at Long Length of Stay Meetings, dates discussed:    Comments:  Late entry:  04/01/12 Verdis Prime RN BSN MSN CCM TC to husband, discussed recommendation and home health agencies, referral made to [facility] per choice.  [facility] liaison notifed.  03/31/12 Gabryel Files RN BSN MSN CCM PT recommending home therapy.  Talked with pt who requested CM discuss home health with her husband.  Unable to reach him, will continue to try.

## 2012-10-07 ENCOUNTER — Other Ambulatory Visit: Payer: Self-pay | Admitting: Family Medicine

## 2012-10-07 ENCOUNTER — Ambulatory Visit
Admission: RE | Admit: 2012-10-07 | Discharge: 2012-10-07 | Disposition: A | Discharge status: Home / Self Care | Payer: Medicare Other | Source: Ambulatory Visit | Attending: Family Medicine | Admitting: Family Medicine

## 2012-10-07 DIAGNOSIS — M545 Low back pain: Secondary | ICD-10-CM

## 2012-12-27 ENCOUNTER — Other Ambulatory Visit: Payer: Self-pay | Admitting: Cardiology

## 2012-12-27 DIAGNOSIS — Z79899 Other long term (current) drug therapy: Secondary | ICD-10-CM

## 2012-12-27 DIAGNOSIS — I4891 Unspecified atrial fibrillation: Secondary | ICD-10-CM

## 2013-03-11 ENCOUNTER — Telehealth: Payer: Self-pay | Admitting: *Deleted

## 2013-03-11 ENCOUNTER — Other Ambulatory Visit: Payer: Self-pay | Admitting: Cardiology

## 2013-03-11 MED ORDER — FUROSEMIDE 40 MG PO TABS: 40.0000 mg | ORAL_TABLET | Freq: Every day | ORAL | Status: DC

## 2013-03-11 NOTE — Telephone Encounter (Signed)
Rx sent to pharmacy   

## 2013-03-11 NOTE — Telephone Encounter (Signed)
Patient needs refill of lasix to be sent to [facility] [address]. Thanks, MI

## 2013-04-15 ENCOUNTER — Other Ambulatory Visit: Payer: Self-pay

## 2013-04-15 MED ORDER — RIVAROXABAN 15 MG PO TABS: 15.0000 mg | ORAL_TABLET | Freq: Every day | ORAL | Status: DC

## 2013-04-15 MED ORDER — METOPROLOL TARTRATE 50 MG PO TABS: 50.0000 mg | ORAL_TABLET | Freq: Two times a day (BID) | ORAL | Status: DC

## 2013-06-24 ENCOUNTER — Ambulatory Visit: Payer: Medicare Other | Admitting: Cardiology

## 2013-06-27 ENCOUNTER — Other Ambulatory Visit: Payer: Medicare Other

## 2013-06-28 ENCOUNTER — Encounter: Payer: Self-pay | Admitting: Cardiology

## 2013-06-28 ENCOUNTER — Ambulatory Visit (INDEPENDENT_AMBULATORY_CARE_PROVIDER_SITE_OTHER): Payer: Medicare Other | Admitting: Cardiology

## 2013-06-28 ENCOUNTER — Other Ambulatory Visit (INDEPENDENT_AMBULATORY_CARE_PROVIDER_SITE_OTHER): Payer: Medicare Other

## 2013-06-28 VITALS — BP 122/66 | HR 79 | Ht 65.0 in | Wt 191.0 lb

## 2013-06-28 DIAGNOSIS — I4891 Unspecified atrial fibrillation: Secondary | ICD-10-CM

## 2013-06-28 DIAGNOSIS — Z79899 Other long term (current) drug therapy: Secondary | ICD-10-CM

## 2013-06-28 DIAGNOSIS — I509 Heart failure, unspecified: Secondary | ICD-10-CM

## 2013-06-28 DIAGNOSIS — R0602 Shortness of breath: Secondary | ICD-10-CM

## 2013-06-28 DIAGNOSIS — R42 Dizziness and giddiness: Secondary | ICD-10-CM

## 2013-06-28 DIAGNOSIS — I10 Essential (primary) hypertension: Secondary | ICD-10-CM

## 2013-06-28 LAB — CBC
HEMATOCRIT: 44.8 % (ref 36.0–46.0)
Hemoglobin: 14.9 g/dL (ref 12.0–15.0)
MCHC: 33.3 g/dL (ref 30.0–36.0)
MCV: 94.9 fl (ref 78.0–100.0)
Platelets: 154 10*3/uL (ref 150.0–400.0)
RBC: 4.72 Mil/uL (ref 3.87–5.11)
RDW: 12.9 % (ref 11.5–14.6)
WBC: 6.6 10*3/uL (ref 4.5–10.5)

## 2013-06-28 LAB — CREATININE, SERUM: CREATININE: 1.1 mg/dL (ref 0.4–1.2)

## 2013-06-28 MED ORDER — FUROSEMIDE 40 MG PO TABS: 20.0000 mg | ORAL_TABLET | Freq: Every day | ORAL | Status: DC

## 2013-06-28 NOTE — Progress Notes (Signed)
[address] [place], [place]  [id number] Phone: [phone] Fax:  [phone]  Date:  06/28/2013   ID:  Heidi Miranda, DOB 03/08/1933, MRN [id number]  PCP:  Sissy HoffSWAYNE,DAVID W, MD   History of Present Illness: Heidi Miranda is a 80 y.o. female with hospitalization for atrial fibrillation and newly discovered systolic heart failure in 12/13. Also hx of TIA, decrease memory and hypertension. Echocardiogram 06/24/12 with EF 25-30% and severely reduced LV function, grade 3 diastolic dysfunction. She had been experiencing NYHA class 3 symptoms.  During admission she experienced a 2.8 second pause and because of this did not receive digoxin. She also had a 12 beat run of wide-complex tachycardia and her Toprol was increased in January.  She continues to have shortness of breath. NYHA 3 symptoms. At last visit, low-dose Lasix, 20 mg was initiated. She has lost 3 pounds which is not felt any better from a symptom standpoint. On 07/08/12, I increased her Lasix to 40 mg twice a day. We also discussed defibrillator. I discussed the possibility of sudden cardiac death. They wish to hold off on this at this time. She is also not had a cardiac catheterization and this will be standard workup. Husband is not very eager proceeding with this.  07/14/12 -She has lost 8 pounds of fluid. Doing better. Less dyspnea. Her blood pressure is soft today at 100 systolic. In the past I have noticed low blood pressures as well. We will continue to monitor her closely.  08/17/12 - Spoke of fear of bugs, slugs. Scared of this. Nightmares. She will scream at times. Breathing is better. She walked in for the first time in a while.  12/21/12 - Went to [place]. Doing well. No CP, no SOB. Had one instance when coming back from airport of fleeting CP, tightness.   Patient denies chest pain, syncope, swelling, nor PND.Mild palpitaitons, occasional dizziness, no falls.  06/28/13 - feels weak and uneasy. Dizzy spells in  the AM. Steady on her feet most of the day. Would like to walk but she is afraid of falling. Has had disequilibrium. She understands to be careful.    Wt Readings from Last 3 Encounters:  06/28/13 191 lb (86.637 kg)  03/31/12 193 lb 1.6 oz (87.59 kg)     Past Medical History  Diagnosis Date  . Hypertension   . TIA (transient ischemic attack)   . Anxiety and depression   . CVA (cerebral infarction)     , right parietal region  . GERD (gastroesophageal reflux disease)   . Hypertension   . Chronic neck pain   . OA (osteoarthritis) of knee   . Sleep apnea     , (Split 02/10/06 AHI 24/hr, O2 mi n 89%; CPAP 7 cm water pressure)  . Hemorrhoids   . Back pain   . Hyperlipidemia   . Chronic kidney disease   . Atrial fibrillation     Past Surgical History  Procedure Laterality Date  . Knee surgery      Current Outpatient Prescriptions  Medication Sig Dispense Refill  . ALPRAZolam (XANAX) 0.25 MG tablet Take 0.25 mg by mouth 3 (three) times daily as needed. For anxiety or dizziness      . atorvastatin (LIPITOR) 20 MG tablet Take 20 mg by mouth every morning.       Marland Kitchen. CALCIUM PO Take 1 tablet by mouth daily.      . Cetirizine HCl (ZYRTEC PO) Take 1  tablet by mouth daily.      . citalopram (CELEXA) 20 MG tablet Take 20 mg by mouth every morning.       . furosemide (LASIX) 40 MG tablet Take 1 tablet (40 mg total) by mouth daily.  30 tablet  5  . Lansoprazole (PREVACID PO) Take 1 tablet by mouth at bedtime.      Marland Kitchen lisinopril (PRINIVIL,ZESTRIL) 2.5 MG tablet Take 2.5 mg by mouth daily.      . metoprolol succinate (TOPROL-XL) 100 MG 24 hr tablet Take 100 mg by mouth daily. Take with or immediately following a meal. 50 mg      . Multiple Vitamin (MULTIVITAMIN WITH MINERALS) TABS Take 1 tablet by mouth daily.      . Rivaroxaban (XARELTO) 20 MG TABS tablet Take 20 mg by mouth daily with supper. TAKES 15 MG      . verapamil (COVERA HS) 240 MG (CO) 24 hr tablet Take 240 mg by mouth at bedtime.        No current facility-administered medications for this visit.    Allergies:    Allergies  Allergen Reactions  . Penicillins Hives    Social History:  The patient  reports that she has never smoked. She does not have any smokeless tobacco history on file. She reports that she drinks alcohol. She reports that she does not use illicit drugs.   ROS:  Please see the history of present illness.   Denies any chest pain, fevers, chills, strokelike symptoms, syncope  PHYSICAL EXAM: VS:  BP 122/66  Pulse 79  Ht 5\' 5"  (1.651 m)  Wt 191 lb (86.637 kg)  BMI 31.78 kg/m2 Well nourished, well developed, in no acute distress HEENT: normal Neck: no JVD Cardiac:  normal S1, S2; RRR; no murmur Lungs:  clear to auscultation bilaterally, no wheezing, rhonchi or rales Abd: soft, nontender, no hepatomegaly Ext: no edema Skin: warm and dry Neuro: no focal abnormalities noted  EKG:  06/28/13-atrial fibrillation rate 79, poor R wave progression, nonspecific ST-T wave changes.     ASSESSMENT AND PLAN:  1. Atrial fibrillation-continuing with rate control. Overall well-controlled. 2. Chronic anticoagulation-continue current medication. Checking lab work. 3. Disequilibrium-I'm going to discontinue her low-dose lisinopril 2.5 mg. This could also be a vestibular issue. 4. Hypertension-very well controlled. 5. Dyspnea-improved. Continue with low-dose Lasix, 20 mg. Checking lab work.  Signed, Donato Schultz, MD [facility]  06/28/2013 9:45 AM

## 2013-06-28 NOTE — Patient Instructions (Signed)
Your physician has recommended you make the following change in your medication:   1. Stop Lisinopril 2. Decrease Lasix to 20 mg once daily.  Your physician wants you to follow-up in: 6 months with Dr. Anne FuSkains. You will receive a reminder letter in the mail two months in advance. If you don't receive a letter, please call our office to schedule the follow-up appointment.

## 2013-07-02 ENCOUNTER — Other Ambulatory Visit: Payer: Self-pay | Admitting: Cardiology

## 2013-07-14 ENCOUNTER — Other Ambulatory Visit: Payer: Self-pay | Admitting: Cardiology

## 2013-07-14 NOTE — Telephone Encounter (Signed)
Which form of metoprolol should this patient be on? Please clarify. Thanks, MI

## 2013-08-23 ENCOUNTER — Other Ambulatory Visit: Payer: Self-pay | Admitting: *Deleted

## 2013-09-21 ENCOUNTER — Other Ambulatory Visit: Payer: Self-pay | Admitting: Cardiology

## 2013-09-21 NOTE — Telephone Encounter (Signed)
Can you clarify the dose on this? Thanks, MI

## 2014-02-03 ENCOUNTER — Other Ambulatory Visit: Payer: Self-pay | Admitting: Interventional Cardiology

## 2014-02-03 MED ORDER — FUROSEMIDE 40 MG PO TABS: 20.0000 mg | ORAL_TABLET | Freq: Every day | ORAL | Status: DC

## 2014-02-14 ENCOUNTER — Other Ambulatory Visit: Payer: Self-pay

## 2014-02-14 MED ORDER — METOPROLOL SUCCINATE ER 100 MG PO TB24: 100.0000 mg | ORAL_TABLET | Freq: Every day | ORAL | Status: DC

## 2014-02-17 ENCOUNTER — Other Ambulatory Visit: Payer: Self-pay

## 2014-02-17 MED ORDER — METOPROLOL SUCCINATE ER 100 MG PO TB24: 100.0000 mg | ORAL_TABLET | Freq: Every day | ORAL | Status: DC

## 2014-03-03 ENCOUNTER — Other Ambulatory Visit: Payer: Self-pay | Admitting: Cardiology

## 2014-03-04 NOTE — Telephone Encounter (Signed)
Rx was sent to pharmacy electronically. 

## 2014-03-29 ENCOUNTER — Encounter: Payer: Self-pay | Admitting: Cardiology

## 2014-03-29 ENCOUNTER — Ambulatory Visit (INDEPENDENT_AMBULATORY_CARE_PROVIDER_SITE_OTHER): Payer: Medicare Other | Admitting: Cardiology

## 2014-03-29 VITALS — BP 118/72 | HR 78 | Ht 65.0 in | Wt 207.0 lb

## 2014-03-29 DIAGNOSIS — Z79899 Other long term (current) drug therapy: Secondary | ICD-10-CM

## 2014-03-29 DIAGNOSIS — I10 Essential (primary) hypertension: Secondary | ICD-10-CM

## 2014-03-29 DIAGNOSIS — I4819 Other persistent atrial fibrillation: Secondary | ICD-10-CM

## 2014-03-29 DIAGNOSIS — I481 Persistent atrial fibrillation: Secondary | ICD-10-CM

## 2014-03-29 NOTE — Progress Notes (Signed)
[address] [place], [place]  [address] Phone: [phone](336) [phone] Fax:  [phone]  Date:  03/29/2014   ID:  Heidi LaineDixie B Teeple, DOB 12/15/1932, MRN [id number]  PCP:  Sissy HoffSWAYNE,DAVID W, MD   History of Present Illness: Heidi Miranda is a 80 y.o. female with hospitalization for atrial fibrillation and newly discovered systolic heart failure in 12/13. Also hx of TIA, decrease memory and hypertension. Echocardiogram 06/24/12 with EF 25-30% and severely reduced LV function, grade 3 diastolic dysfunction. She had been experiencing NYHA class 3 symptoms.  During admission she experienced a 2.8 second pause and because of this did not receive digoxin. She also had a 12 beat run of wide-complex tachycardia and her Toprol was increased in January.  She continues to have shortness of breath. NYHA 3 symptoms. At last visit, low-dose Lasix, 20 mg was initiated. She has lost 3 pounds which is not felt any better from a symptom standpoint. On 07/08/12, I increased her Lasix to 40 mg twice a day. We also discussed defibrillator. I discussed the possibility of sudden cardiac death. They wish to hold off on this at this time. She is also not had a cardiac catheterization and this will be standard workup. Husband is not very eager proceeding with this.  07/14/12 -She has lost 8 pounds of fluid. Doing better. Less dyspnea. Her blood pressure is soft today at 100 systolic. In the past I have noticed low blood pressures as well. We will continue to monitor her closely.  08/17/12 - Spoke of fear of bugs, slugs. Scared of this. Nightmares. She will scream at times. Breathing is better. She walked in for the first time in a while.  12/21/12 - Went to [place]. Doing well. No CP, no SOB. Had one instance when coming back from airport of fleeting CP, tightness.   Patient denies chest pain, syncope, swelling, nor PND.Mild palpitaitons, occasional dizziness, no falls.  06/28/13 - feels weak and uneasy. Dizzy spells in  the AM. Steady on her feet most of the day. Would like to walk but she is afraid of falling. Has had disequilibrium. She understands to be careful.  03/29/14-enjoyed her time with her daughters in [place]. Gained 15 pounds. No significant shortness of breath. Main complaints are tingling in her feet when getting up in the morning. She has had a prior surgery to release nerve in her ankle similar to carpal tunnel syndrome. This did not help. Her husband sees Dr. Jens Somrenshaw.    Wt Readings from Last 3 Encounters:  03/29/14 207 lb (93.895 kg)  06/28/13 191 lb (86.637 kg)  03/31/12 193 lb 1.6 oz (87.59 kg)     Past Medical History  Diagnosis Date  . Hypertension   . TIA (transient ischemic attack)   . Anxiety and depression   . CVA (cerebral infarction)     , right parietal region  . GERD (gastroesophageal reflux disease)   . Hypertension   . Chronic neck pain   . OA (osteoarthritis) of knee   . Sleep apnea     , (Split 02/10/06 AHI 24/hr, O2 mi n 89%; CPAP 7 cm water pressure)  . Hemorrhoids   . Back pain   . Hyperlipidemia   . Chronic kidney disease   . Atrial fibrillation     Past Surgical History  Procedure Laterality Date  . Knee surgery      Current Outpatient Prescriptions  Medication Sig Dispense Refill  . ALPRAZolam (XANAX) 0.25  MG tablet Take 0.25 mg by mouth 3 (three) times daily as needed. For anxiety or dizziness    . atorvastatin (LIPITOR) 20 MG tablet Take 20 mg by mouth every morning.     Marland Kitchen. CALCIUM PO Take 1 tablet by mouth daily.    . Cetirizine HCl (ZYRTEC PO) Take 1 tablet by mouth daily.    . citalopram (CELEXA) 20 MG tablet Take 20 mg by mouth every morning.     . furosemide (LASIX) 40 MG tablet Take 0.5 tablets (20 mg total) by mouth daily. Or as directed. 30 tablet 1  . Lansoprazole (PREVACID PO) Take 1 tablet by mouth at bedtime.    . metoprolol succinate (TOPROL-XL) 100 MG 24 hr tablet Take 1 tablet (100 mg total) by mouth daily. Take with or  immediately following a meal. 30 tablet 4  . Multiple Vitamin (MULTIVITAMIN WITH MINERALS) TABS Take 1 tablet by mouth daily.    . verapamil (COVERA HS) 240 MG (CO) 24 hr tablet Take 240 mg by mouth at bedtime.    Carlena Hurl. XARELTO 15 MG TABS tablet TAKE ONE TABLET BY MOUTH ONCE DAILY WITH  SUPPER 30 tablet 1   No current facility-administered medications for this visit.    Allergies:    Allergies  Allergen Reactions  . Penicillins Hives    Social History:  The patient  reports that she has never smoked. She does not have any smokeless tobacco history on file. She reports that she drinks alcohol. She reports that she does not use illicit drugs.   ROS:  Please see the history of present illness.  Peripheral neur. Denies any chest pain, fevers, chills, strokelike symptoms, syncope  PHYSICAL EXAM: VS:  BP 118/72 mmHg  Pulse 78  Ht 5\' 5"  (1.651 m)  Wt 207 lb (93.895 kg)  BMI 34.45 kg/m2 Well nourished, well developed, in no acute distress HEENT: normal Neck: no JVD Cardiac:  normal S1, S2; irreg; no murmur Lungs:  clear to auscultation bilaterally, no wheezing, rhonchi or rales Abd: soft, nontender, no hepatomegaly Ext: no edema Skin: warm and dry Neuro: no focal abnormalities noted  EKG:  06/28/13-atrial fibrillation rate 79, poor R wave progression, nonspecific ST-T wave changes.     ASSESSMENT AND PLAN:  1. Atrial fibrillation-continuing with rate control. Overall well-controlled. 2. Chronic anticoagulation-continue current medication. Checking lab work every 6 months, prior creatinine 1.1. Hemoglobin 14.9. 3. Disequilibrium-Previously discontinued her low-dose lisinopril 2.5 mg. may not have helped much. Uses a cane.  4. Hypertension-very well controlled. 5. Dyspnea-improved. Continue with low-dose Lasix, 20 mg. Checking lab work. 6. Cardiomyopathy-prior ejection fraction 35%. Currently not on ACE inhibitor because of relative hypotension. Repeating  echocardiogram. 7. Obesity-encourage weight loss.  Signed, Donato SchultzMark Skains, MD [facility]  03/29/2014 4:22 PM

## 2014-03-29 NOTE — Patient Instructions (Signed)
The current medical regimen is effective;  continue present plan and medications.  Your physician has requested that you have an echocardiogram. Echocardiography is a painless test that uses sound waves to create images of your heart. It provides your doctor with information about the size and shape of your heart and how well your heart's chambers and valves are working. This procedure takes approximately one hour. There are no restrictions for this procedure.  Please have blood work today (BMP,CBC)  Follow up in 6 months with Dr. Anne FuSkains.  You will receive a letter in the mail 2 months before you are due.  Please call us when you receive this letter to schedule your follow up appointment.

## 2014-03-30 LAB — BASIC METABOLIC PANEL
BUN: 20 mg/dL (ref 6–23)
CALCIUM: 9.1 mg/dL (ref 8.4–10.5)
CO2: 29 meq/L (ref 19–32)
Chloride: 106 mEq/L (ref 96–112)
Creatinine, Ser: 1 mg/dL (ref 0.4–1.2)
GFR: 58.15 mL/min — AB (ref >60.00)
GLUCOSE: 79 mg/dL (ref 70–99)
POTASSIUM: 4.1 meq/L (ref 3.5–5.1)
SODIUM: 139 meq/L (ref 135–145)

## 2014-03-30 LAB — CBC
HCT: 38.6 % (ref 36.0–46.0)
HEMOGLOBIN: 13.4 g/dL (ref 12.0–15.0)
MCHC: 34.8 g/dL (ref 30.0–36.0)
MCV: 100.1 fl — AB (ref 78.0–100.0)
PLATELETS: 132 10*3/uL — AB (ref 150.0–400.0)
RBC: 3.86 Mil/uL — AB (ref 3.87–5.11)
RDW: 12.8 % (ref 11.5–15.5)
WBC: 6.4 10*3/uL (ref 4.0–10.5)

## 2014-04-04 ENCOUNTER — Ambulatory Visit (HOSPITAL_COMMUNITY): Payer: Medicare Other | Attending: Cardiovascular Disease | Admitting: Radiology

## 2014-04-04 DIAGNOSIS — I4891 Unspecified atrial fibrillation: Secondary | ICD-10-CM | POA: Diagnosis present

## 2014-04-04 DIAGNOSIS — I4819 Other persistent atrial fibrillation: Secondary | ICD-10-CM

## 2014-04-04 DIAGNOSIS — I10 Essential (primary) hypertension: Secondary | ICD-10-CM

## 2014-04-04 NOTE — Progress Notes (Signed)
Echocardiogram performed.  

## 2014-04-07 ENCOUNTER — Telehealth: Payer: Self-pay | Admitting: Cardiology

## 2014-04-07 NOTE — Telephone Encounter (Signed)
New Message   Pt husband called, stating a message was left for pt to sched test/be notified of results.  No notes about phone call. Please call back and discuss.

## 2014-04-07 NOTE — Telephone Encounter (Signed)
Spoke with husband - pt is unable to remember "anything"  Reviewed results of echo and scheduled pt for an appointment to discuss severe mitral valve regurg.

## 2014-04-20 ENCOUNTER — Ambulatory Visit: Payer: Medicare Other | Admitting: Cardiology

## 2014-04-29 ENCOUNTER — Other Ambulatory Visit: Payer: Self-pay | Admitting: Cardiology

## 2014-05-03 ENCOUNTER — Ambulatory Visit (INDEPENDENT_AMBULATORY_CARE_PROVIDER_SITE_OTHER): Payer: Medicare Other | Admitting: Cardiology

## 2014-05-03 ENCOUNTER — Encounter: Payer: Self-pay | Admitting: Cardiology

## 2014-05-03 VITALS — BP 130/70 | HR 84 | Ht 64.0 in | Wt 206.0 lb

## 2014-05-03 DIAGNOSIS — R0602 Shortness of breath: Secondary | ICD-10-CM

## 2014-05-03 DIAGNOSIS — I4819 Other persistent atrial fibrillation: Secondary | ICD-10-CM

## 2014-05-03 DIAGNOSIS — I10 Essential (primary) hypertension: Secondary | ICD-10-CM

## 2014-05-03 DIAGNOSIS — I481 Persistent atrial fibrillation: Secondary | ICD-10-CM

## 2014-05-03 NOTE — Patient Instructions (Signed)
The current medical regimen is effective;  continue present plan and medications.  Follow up in 6 months with Dr. Skains.  You will receive a letter in the mail 2 months before you are due.  Please call us when you receive this letter to schedule your follow up appointment.  Thank you for choosing [facility]!!     

## 2014-05-03 NOTE — Progress Notes (Signed)
[address] [place], [place]  [address] Phone: [phone](336) [phone] Fax:  [phone]  Date:  05/03/2014   ID:  Heidi Miranda Kleinfelter, DOB 12/28/1932, MRN [id number]  PCP:  Sissy HoffSWAYNE,DAVID W, MD   History of Present Illness: Heidi Miranda Zynda is a 81 y.o. female with hospitalization for atrial fibrillation and newly discovered systolic heart failure in 12/13. Also hx of TIA, decrease memory and hypertension. Echocardiogram 06/24/12 with EF 25-30% and severely reduced LV function, grade 3 diastolic dysfunction. She had been experiencing NYHA class 3 symptoms.  During admission she experienced a 2.8 second pause and because of this did not receive digoxin. She also had a 12 beat run of wide-complex tachycardia and her Toprol was increased in January.  She continues to have shortness of breath. NYHA 3 symptoms. At last visit, low-dose Lasix, 20 mg was initiated. She has lost 3 pounds which is not felt any better from a symptom standpoint. On 07/08/12, I increased her Lasix to 40 mg twice a day. We also discussed defibrillator. I discussed the possibility of sudden cardiac death. They wish to hold off on this at this time. She is also not had a cardiac catheterization and this will be standard workup. Husband is not very eager proceeding with this.  07/14/12 -She has lost 8 pounds of fluid. Doing better. Less dyspnea. Her blood pressure is soft today at 100 systolic. In the past I have noticed low blood pressures as well. We will continue to monitor her closely.  08/17/12 - Spoke of fear of bugs, slugs. Scared of this. Nightmares. She will scream at times. Breathing is better. She walked in for the first time in a while.  12/21/12 - Went to [place]. Doing well. No CP, no SOB. Had one instance when coming back from airport of fleeting CP, tightness.   Patient denies chest pain, syncope, swelling, nor PND.Mild palpitaitons, occasional dizziness, no falls.  06/28/13 - feels weak and uneasy. Dizzy spells in  the AM. Steady on her feet most of the day. Would like to walk but she is afraid of falling. Has had disequilibrium. She understands to be careful.  03/29/14-enjoyed her time with her daughters in [place]. Gained 15 pounds. No significant shortness of breath. Main complaints are tingling in her feet when getting up in the morning. She has had a prior surgery to release nerve in her ankle similar to carpal tunnel syndrome. This did not help. Her husband sees Dr. Jens Somrenshaw.  05/03/2014 - overall she's doing okay. Previous echo card gram was read as severe mitral regurgitation. I personally reviewed it and it looks more in the moderate range. Dr. Shirlee LatchMcLean agrees. No shortness of breath. I checked her dorsalis pedis pulse which is difficult to palpate with Doppler. Her Doppler pulse was normal. It is still challenging to get a posterior tibialis Doppler. She has had prior surgeries on her ankles.    Wt Readings from Last 3 Encounters:  05/03/14 206 lb (93.441 kg)  03/29/14 207 lb (93.895 kg)  06/28/13 191 lb (86.637 kg)     Past Medical History  Diagnosis Date  . Hypertension   . TIA (transient ischemic attack)   . Anxiety and depression   . CVA (cerebral infarction)     , right parietal region  . GERD (gastroesophageal reflux disease)   . Hypertension   . Chronic neck pain   . OA (osteoarthritis) of knee   . Sleep apnea     , (  Split 02/10/06 AHI 24/hr, O2 mi n 89%; CPAP 7 cm water pressure)  . Hemorrhoids   . Back pain   . Hyperlipidemia   . Chronic kidney disease   . Atrial fibrillation     Past Surgical History  Procedure Laterality Date  . Knee surgery      Current Outpatient Prescriptions  Medication Sig Dispense Refill  . ALPRAZolam (XANAX) 0.25 MG tablet Take 0.25 mg by mouth 3 (three) times daily as needed. For anxiety or dizziness    . atorvastatin (LIPITOR) 20 MG tablet Take 20 mg by mouth every morning.     Marland Kitchen CALCIUM PO Take 1 tablet by mouth daily.    . Cetirizine HCl  (ZYRTEC PO) Take 1 tablet by mouth daily.    . citalopram (CELEXA) 20 MG tablet Take 20 mg by mouth every morning.     . furosemide (LASIX) 40 MG tablet Take 0.5 tablets (20 mg total) by mouth daily. Or as directed. 30 tablet 1  . Lansoprazole (PREVACID PO) Take 1 tablet by mouth at bedtime.    . metoprolol succinate (TOPROL-XL) 100 MG 24 hr tablet Take 1 tablet (100 mg total) by mouth daily. Take with or immediately following a meal. 30 tablet 4  . Multiple Vitamin (MULTIVITAMIN WITH MINERALS) TABS Take 1 tablet by mouth daily.    Carlena Hurl 15 MG TABS tablet TAKE ONE TABLET BY MOUTH ONCE DAILY WITH SUPPER 30 tablet 3  . verapamil (COVERA HS) 240 MG (CO) 24 hr tablet Take 240 mg by mouth at bedtime.     No current facility-administered medications for this visit.    Allergies:    Allergies  Allergen Reactions  . Penicillins Hives    Social History:  The patient  reports that she has never smoked. She does not have any smokeless tobacco history on file. She reports that she drinks alcohol. She reports that she does not use illicit drugs.   ROS:  Please see the history of present illness.  Peripheral neur. Denies any chest pain, fevers, chills, strokelike symptoms, syncope  PHYSICAL EXAM: VS:  BP 130/70 mmHg  Pulse 84  Ht  (1.626 m)  Wt 206 lb (93.441 kg)  BMI 35.34 kg/m2 Well nourished, well developed, in no acute distress HEENT: normal Neck: no JVD Cardiac:  normal S1, S2; irreg; no murmur Lungs:  clear to auscultation bilaterally, no wheezing, rhonchi or rales Abd: soft, nontender, no hepatomegaly Ext: no edemaDecreaed pulses distally.  Skin: warm and dry Neuro: no focal abnormalities noted  EKG:  05/03/14-atrial fibrillation, heart rate 84. 06/28/13-atrial fibrillation rate 79, poor R wave progression, nonspecific ST-T wave changes.     ASSESSMENT AND PLAN:  1. Atrial fibrillation-continuing with rate control. Overall well-controlled. 2. Chronic anticoagulation-continue  current medication. Checking lab work every 6 months, prior creatinine 1.1. Hemoglobin 14.9. 3. Disequilibrium-Previously discontinued her low-dose lisinopril 2.5 mg. may not have helped much. Uses a cane.  4. Hypertension-very well controlled. 5. Dyspnea-improved. Continue with low-dose Lasix, 20 mg.  6. Cardiomyopathy-prior ejection fraction 35%. Currently not on ACE inhibitor because of relative hypotension. Current echocardiogram shows 40%. 7. Moderate mitral regurgitation-we'll continue to monitor. This does not appear severe upon personal review.. 8. Obesity-encourage weight loss.  Signed, Donato Schultz, MD [facility]  05/03/2014 3:42 PM

## 2014-06-03 ENCOUNTER — Other Ambulatory Visit: Payer: Self-pay | Admitting: Interventional Cardiology

## 2014-07-03 ENCOUNTER — Ambulatory Visit: Payer: Self-pay | Admitting: Neurology

## 2014-07-10 ENCOUNTER — Ambulatory Visit (INDEPENDENT_AMBULATORY_CARE_PROVIDER_SITE_OTHER): Payer: Medicare Other | Admitting: Neurology

## 2014-07-10 ENCOUNTER — Encounter: Payer: Self-pay | Admitting: Neurology

## 2014-07-10 VITALS — BP 126/80 | HR 45 | Ht 64.0 in | Wt 204.0 lb

## 2014-07-10 DIAGNOSIS — E538 Deficiency of other specified B group vitamins: Secondary | ICD-10-CM

## 2014-07-10 DIAGNOSIS — R413 Other amnesia: Secondary | ICD-10-CM

## 2014-07-10 DIAGNOSIS — R4189 Other symptoms and signs involving cognitive functions and awareness: Secondary | ICD-10-CM | POA: Diagnosis not present

## 2014-07-10 DIAGNOSIS — R5382 Chronic fatigue, unspecified: Secondary | ICD-10-CM | POA: Diagnosis not present

## 2014-07-10 MED ORDER — DONEPEZIL HCL 5 MG PO TABS: 5.0000 mg | ORAL_TABLET | Freq: Every day | ORAL | Status: DC

## 2014-07-10 NOTE — Patient Instructions (Addendum)
Overall you are doing fairly well but I do want to suggest a few things today:   Remember to drink plenty of fluid, eat healthy meals and do not skip any meals. Try to eat protein with a every meal and eat a healthy snack such as fruit or nuts in between meals. Try to keep a regular sleep-wake schedule and try to exercise daily, particularly in the form of walking, 20-30 minutes a day, if you can.   As far as your medications are concerned, I would like to suggest: Aricept 5mg  and at next primary cae appointment can increase to 10mg   As far as diagnostic testing: MRI brain  I would like to see you back as needed , sooner if we need to. Please call us with any interim questions, concerns, problems, updates or refill requests.   Please also call us for any test results so we can go over those with you on the phone.  My clinical assistant and will answer any of your questions and relay your messages to me and also relay most of my messages to you.   Our phone number is [phone]. We also have an after hours call service for urgent matters and there is a physician on-call for urgent questions. For any emergencies you know to call 911 or go to the nearest emergency room

## 2014-07-10 NOTE — Progress Notes (Signed)
[facility]    Provider:  Dr Lucia GaskinsAhern Referring Provider: Tally JoeSwayne, David, MD Primary Care Physician:  Sissy HoffSWAYNE,DAVID W, MD  CC:  Memory problems  HPI:  Heidi Miranda is a 81 y.o. female here as a referral from Dr. Azucena CecilSwayne for memory problems  Memory problems started a " long time ago" unknown how long. Husband providesmost of the infpormation. 5-8 years ago. She is a poor historian. She can't remember the day. She can't rememebr appointments. She can take a phone call in the living room and make an appointment and walk tot kitchen then can't remember what it was. Progressive. Her daughter had her brain tested because she was also getting forgetful. Shows the "part of her brain that has her short term memory is dieing". Her daughetr is 2563. Patient doesn't like to drive, not interested in hobbies naymore, loves to see family that thrills her. More new memories having a difficult time with. She can't do things that are complicated, she can't use computers. She doesn't cook anymore, they make sandwhiches. She has not left the stove on or had any accident. Husband pays the bills. She slipped in the shower. No head trauma. She is depressed because she doesn't have any friends, she just stays at home all the time and she is afraid all the time. No tremor  Reviewed notes, labs and imaging from outside physicians, which showed: B12, TSH wnl.   Review of Systems: Patient complains of symptoms per HPI as well as the following symptoms; SOB, cough, snoring, hearing loss, incontinence, joint pain, allergies, runny nose, memory loss, confusion, headaches, numbness, weakness, sleepiness, snoring, depression,anxiety, disinterest inactivities, hallucinations. Pertinent negatives per HPI. All others negative.   History   Social History  . Marital Status: Married    Spouse Name: [name]  . Number of Children: 6  . Years of Education: HS   Occupational History  . Retired    Social History Main  Topics  . Smoking status: Never Smoker   . Smokeless tobacco: Never Used  . Alcohol Use: 0.0 oz/week    0 Standard drinks or equivalent per week     Comment: daily  . Drug Use: No  . Sexual Activity: Not on file   Other Topics Concern  . Not on file   Social History Narrative   Pt lives at home with her spouse.   Caffeine Use:     Family History  Problem Relation Age of Onset  . Diabetes Brother   . Heart attack Brother   . Alcohol abuse Brother   . Hypertension Mother   . Breast cancer Mother   . Heart attack Father   . Alcohol abuse Father   . Hypertension Sister   . Stroke Maternal Grandmother   . Stroke Paternal Grandmother   . Hypertension Sister     Past Medical History  Diagnosis Date  . Hypertension   . TIA (transient ischemic attack)   . Anxiety and depression   . CVA (cerebral infarction)     , right parietal region  . GERD (gastroesophageal reflux disease)   . Hypertension   . Chronic neck pain   . OA (osteoarthritis) of knee   . Sleep apnea     , (Split 02/10/06 AHI 24/hr, O2 mi n 89%; CPAP 7 cm water pressure)  . Hemorrhoids   . Back pain   . Hyperlipidemia   . Chronic kidney disease   . Atrial fibrillation     Past Surgical History  Procedure Laterality Date  . Knee surgery      Current Outpatient Prescriptions  Medication Sig Dispense Refill  . ALPRAZolam (XANAX) 0.25 MG tablet Take 0.25 mg by mouth 3 (three) times daily as needed. For anxiety or dizziness    . atorvastatin (LIPITOR) 20 MG tablet Take 20 mg by mouth every morning.     Marland Kitchen CALCIUM PO Take 1 tablet by mouth daily.    . Cetirizine HCl (ZYRTEC PO) Take 1 tablet by mouth daily.    . citalopram (CELEXA) 20 MG tablet Take 20 mg by mouth every morning.     . Lansoprazole (PREVACID PO) Take 1 tablet by mouth at bedtime.    . metoprolol succinate (TOPROL-XL) 100 MG 24 hr tablet Take 1 tablet (100 mg total) by mouth daily. Take with or immediately following a meal. 30 tablet 4  .  Multiple Vitamin (MULTIVITAMIN WITH MINERALS) TABS Take 1 tablet by mouth daily.    Carlena Hurl 15 MG TABS tablet TAKE ONE TABLET BY MOUTH ONCE DAILY WITH SUPPER 30 tablet 3  . furosemide (LASIX) 40 MG tablet TAKE ONE-HALF TABLET BY MOUTH ONCE DAILY OR AS  DIRECTED (Patient not taking: Reported on 07/10/2014) 30 tablet 11  . verapamil (COVERA HS) 240 MG (CO) 24 hr tablet Take 240 mg by mouth at bedtime.     No current facility-administered medications for this visit.    Allergies as of 07/10/2014 - Review Complete 07/10/2014  Allergen Reaction Noted  . Penicillins Hives 03/22/2012    Vitals: BP 126/80 mmHg  Pulse 45  Ht  (1.626 m)  Wt 204 lb (92.534 kg)  BMI 35.00 kg/m2 Last Weight:  Wt Readings from Last 1 Encounters:  07/10/14 204 lb (92.534 kg)   Last Height:   Ht Readings from Last 1 Encounters:  07/10/14  (1.626 m)    Physical exam: Exam: Gen: NAD, conversant, well nourised, obese, well groomed                     CV: no MRG. No Carotid Bruits. No peripheral edema, warm, nontender Eyes: Conjunctivae clear without exudates or hemorrhage  Neuro: Detailed Neurologic Exam  Speech:    Speech is normal; fluent and spontaneous with normal comprehension.  Cognition: moCA 16/30 (-2 visuospatial/execution, -2serial 7, -2 language/fluency,-2 abstraction, -5 delayed recall, -1 orientation)    The patient is oriented to person, place, and time;     recent memory impaired and remote memory intact;     language fluent;     Impaired attention, concentration,     fund of knowledge grossly intact for education Cranial Nerves:    The pupils are equal, round, and reactive to light. Attempted fundi could not visualize Visual fields are full to finger confrontation. Extraocular movements are intact. Trigeminal sensation is intact and the muscles of mastication are normal. The face is symmetric. The palate elevates in the midline. Hearing intact. Voice is normal. Shoulder shrug is  normal. The tongue has normal motion without fasciculations.   Coordination:    Normal finger to nose and heel to shin. Normal rapid alternating movements.   Gait:    Wide based  Motor Observation:    No asymmetry, no atrophy, and no involuntary movements noted. Tone:    Normal muscle tone.    Posture:    Posture is normal. normal erect    Strength:    Strength is V/V in the upper limbs.  Could not examin LL, declined  due to sensitivity attempted     Sensation: intact to LT     Reflex Exam:  DTR's: brisk patellars and achilles.     Deep tendon reflexes in the upper limbs are normal bilaterally.  She declined lowers. Toes:    The toes are equivocal bilaterally.   Clonus:    Clonus is absent.      Assessment/Plan:  81 year old female with pmhx of htn, afib, hld, afib, TIA, CHF here for cognitive decline  MRI of the brain Highly encouraged cpap use as can be associated with memory complaints aricept , pcp can increase to  Can add namenda at a later time MoCA 16/30, moderate cognitive impairment Declined neurocognitive testing. They declined further evaluation, said they didn't feel patient's cognitive decline was causing significant problems   Naomie Dean, MD  [facility] [address] [place], [place] [address]  Phone [phone] Fax [phone]

## 2014-07-15 DIAGNOSIS — R4189 Other symptoms and signs involving cognitive functions and awareness: Secondary | ICD-10-CM | POA: Insufficient documentation

## 2014-07-31 ENCOUNTER — Ambulatory Visit
Admission: RE | Admit: 2014-07-31 | Discharge: 2014-07-31 | Disposition: A | Discharge status: Home / Self Care | Payer: Medicare Other | Source: Ambulatory Visit | Attending: Neurology | Admitting: Neurology

## 2014-07-31 DIAGNOSIS — R413 Other amnesia: Secondary | ICD-10-CM | POA: Diagnosis not present

## 2014-08-07 ENCOUNTER — Other Ambulatory Visit: Payer: Self-pay | Admitting: Neurology

## 2014-08-07 ENCOUNTER — Telehealth: Payer: Self-pay | Admitting: Neurology

## 2014-08-07 DIAGNOSIS — F0280 Dementia in other diseases classified elsewhere without behavioral disturbance: Secondary | ICD-10-CM

## 2014-08-07 DIAGNOSIS — G309 Alzheimer's disease, unspecified: Principal | ICD-10-CM

## 2014-08-07 MED ORDER — DONEPEZIL HCL 10 MG PO TABS: 10.0000 mg | ORAL_TABLET | Freq: Every day | ORAL | Status: DC

## 2014-08-07 NOTE — Telephone Encounter (Signed)
Spoke to husband. MRi showed moderate generalized cortical atrophy, most pronounced in the mesial temporal lobes. The extent of atrophy has increased when compared to the prior MRI dated 01/17/2008 and the prior CT scan dated 03/22/2012. Increased Aricept to 10mg  daily. If she tolerates, he will call back and we can consider adding Namenda.

## 2014-08-17 ENCOUNTER — Telehealth: Payer: Self-pay | Admitting: Neurology

## 2014-08-17 ENCOUNTER — Other Ambulatory Visit: Payer: Self-pay | Admitting: Neurology

## 2014-08-17 DIAGNOSIS — F0390 Unspecified dementia without behavioral disturbance: Secondary | ICD-10-CM

## 2014-08-17 MED ORDER — RIVASTIGMINE TARTRATE 4.5 MG PO CAPS: 4.5000 mg | ORAL_CAPSULE | Freq: Two times a day (BID) | ORAL | Status: DC

## 2014-08-17 NOTE — Telephone Encounter (Signed)
I will order Exelon. Please let them know,thanks

## 2014-08-17 NOTE — Telephone Encounter (Signed)
I called back.  Mr Heidi Miranda was not at work or at home.  I spoke with Ms Heidi Miranda and explained her med was changed.  She verbalized understanding.

## 2014-08-17 NOTE — Telephone Encounter (Signed)
I called back.  Spoke with Mr Beryle BeamsOtten.  He said the patient has had a few severe episodes of diarrhea while taking Aricept 5mg  (she did not advance to 10mg ), and she has decided she no longer wishes to take this med.  Unsure if an alternate drug would be recommended?  Please advise.  Thank you.

## 2014-08-17 NOTE — Telephone Encounter (Signed)
Pt's husband called and stated that the pt has been experiencing issues with her medication Rx. donepezil (ARICEPT) 10 MG tablet. She has stopped taking it for the last week because it was causing her to have stomach issues. Please call and advise. Husbands work number [phone])

## 2014-09-07 ENCOUNTER — Other Ambulatory Visit: Payer: Self-pay | Admitting: Cardiology

## 2014-11-15 ENCOUNTER — Encounter: Payer: Self-pay | Admitting: Cardiology

## 2014-11-15 ENCOUNTER — Ambulatory Visit (INDEPENDENT_AMBULATORY_CARE_PROVIDER_SITE_OTHER): Payer: Medicare Other | Admitting: Cardiology

## 2014-11-15 ENCOUNTER — Other Ambulatory Visit: Payer: Self-pay | Admitting: Cardiology

## 2014-11-15 VITALS — BP 110/68 | HR 80 | Ht 64.0 in | Wt 204.0 lb

## 2014-11-15 DIAGNOSIS — I482 Chronic atrial fibrillation, unspecified: Secondary | ICD-10-CM

## 2014-11-15 DIAGNOSIS — I10 Essential (primary) hypertension: Secondary | ICD-10-CM

## 2014-11-15 DIAGNOSIS — R4189 Other symptoms and signs involving cognitive functions and awareness: Secondary | ICD-10-CM | POA: Diagnosis not present

## 2014-11-15 DIAGNOSIS — Z7901 Long term (current) use of anticoagulants: Secondary | ICD-10-CM

## 2014-11-15 NOTE — Patient Instructions (Signed)
Medication Instructions:  Your physician recommends that you continue on your current medications as directed. Please refer to the Current Medication list given to you today.  Follow-Up: Follow up in 6 months with Dr. Skains.  You will receive a letter in the mail 2 months before you are due.  Please call us when you receive this letter to schedule your follow up appointment.  Thank you for choosing [facility]!!     

## 2014-11-15 NOTE — Progress Notes (Signed)
[address] [place], [place]  [address] Phone: [phone] Fax:  [phone]  Date:  11/15/2014   ID:  Heidi Miranda, DOB 1932/08/09, MRN [id number]  PCP:  Sissy Hoff, MD   History of Present Illness: Heidi Miranda is a 81 y.o. female with hospitalization for atrial fibrillation and newly discovered systolic heart failure in 12/13. Also hx of TIA, decrease memory and hypertension. Echocardiogram 06/24/12 with EF 25-30% and severely reduced LV function, grade 3 diastolic dysfunction. She had been experiencing NYHA class 3 symptoms.  During admission she experienced a 2.8 second pause and because of this did not receive digoxin. She also had a 12 beat run of wide-complex tachycardia and her Toprol was increased in January.  She continues to have shortness of breath. NYHA 3 symptoms. At last visit, low-dose Lasix, 20 mg was initiated. She has lost 3 pounds which is not felt any better from a symptom standpoint. On 07/08/12, I increased her Lasix to 40 mg twice a day. We also discussed defibrillator. I discussed the possibility of sudden cardiac death. They wish to hold off on this at this time. She is also not had a cardiac catheterization and this will be standard workup. Husband is not very eager proceeding with this.  07/14/12 -She has lost 8 pounds of fluid. Doing better. Less dyspnea. Her blood pressure is soft today at 100 systolic. In the past I have noticed low blood pressures as well. We will continue to monitor her closely.  08/17/12 - Spoke of fear of bugs, slugs. Scared of this. Nightmares. She will scream at times. Breathing is better. She walked in for the first time in a while.  12/21/12 - Went to [place]. Doing well. No CP, no SOB. Had one instance when coming back from airport of fleeting CP, tightness.   Patient denies chest pain, syncope, swelling, nor PND.Mild palpitaitons, occasional dizziness, no falls.  06/28/13 - feels weak and uneasy. Dizzy spells in  the AM. Steady on her feet most of the day. Would like to walk but she is afraid of falling. Has had disequilibrium. She understands to be careful.  03/29/14-enjoyed her time with her daughters in [place]. Gained 15 pounds. No significant shortness of breath. Main complaints are tingling in her feet when getting up in the morning. She has had a prior surgery to release nerve in her ankle similar to carpal tunnel syndrome. This did not help. Her husband sees Dr. Jens Som.  05/03/2014 - overall she's doing okay. Previous echo card gram was read as severe mitral regurgitation. I personally reviewed it and it looks more in the moderate range. Dr. Shirlee Latch agrees. No shortness of breath. I checked her dorsalis pedis pulse which is difficult to palpate with Doppler. Her Doppler pulse was normal. It is still challenging to get a posterior tibialis Doppler. She has had prior surgeries on her ankles.    Wt Readings from Last 3 Encounters:  11/15/14 204 lb (92.534 kg)  07/10/14 204 lb (92.534 kg)  05/03/14 206 lb (93.441 kg)     Past Medical History  Diagnosis Date  . Hypertension   . TIA (transient ischemic attack)   . Anxiety and depression   . CVA (cerebral infarction)     , right parietal region  . GERD (gastroesophageal reflux disease)   . Hypertension   . Chronic neck pain   . OA (osteoarthritis) of knee   . Sleep apnea     , (  Split 02/10/06 AHI 24/hr, O2 mi n 89%; CPAP 7 cm water pressure)  . Hemorrhoids   . Back pain   . Hyperlipidemia   . Chronic kidney disease   . Atrial fibrillation     Past Surgical History  Procedure Laterality Date  . Knee surgery      Current Outpatient Prescriptions  Medication Sig Dispense Refill  . ALPRAZolam (XANAX) 0.25 MG tablet Take 0.25 mg by mouth 3 (three) times daily as needed. For anxiety or dizziness    . atorvastatin (LIPITOR) 20 MG tablet Take 20 mg by mouth every morning.     Marland Kitchen CALCIUM PO Take 1 tablet by mouth daily.    . Cetirizine HCl  (ZYRTEC PO) Take 1 tablet by mouth daily.    . citalopram (CELEXA) 20 MG tablet Take 20 mg by mouth every morning.     . furosemide (LASIX) 20 MG tablet Take 20 mg by mouth daily.    . Lansoprazole (PREVACID PO) Take 1 tablet by mouth at bedtime.    . metoprolol succinate (TOPROL-XL) 100 MG 24 hr tablet Take 1 tablet (100 mg total) by mouth daily. Take with or immediately following a meal. 30 tablet 4  . Multiple Vitamin (MULTIVITAMIN WITH MINERALS) TABS Take 1 tablet by mouth daily.    . verapamil (COVERA HS) 240 MG (CO) 24 hr tablet Take 240 mg by mouth at bedtime.    Heidi Miranda 15 MG TABS tablet TAKE ONE TABLET BY MOUTH ONCE DAILY WITH SUPPER 30 tablet 2   No current facility-administered medications for this visit.    Allergies:    Allergies  Allergen Reactions  . Penicillins Hives    Social History:  The patient  reports that she has never smoked. She has never used smokeless tobacco. She reports that she drinks alcohol. She reports that she does not use illicit drugs.   ROS:  Please see the history of present illness.  Peripheral neur. Denies any chest pain, fevers, chills, strokelike symptoms, syncope  PHYSICAL EXAM: VS:  BP 110/68 mmHg  Pulse 80  Ht  (1.626 m)  Wt 204 lb (92.534 kg)  BMI 35.00 kg/m2  SpO2 96% Well nourished, well developed, in no acute distress HEENT: normal Neck: no JVD Cardiac:  normal S1, S2; irreg; no murmur Lungs:  clear to auscultation bilaterally, no wheezing, rhonchi or rales Abd: soft, nontender, no hepatomegaly Ext: no edemaDecreaed pulses distally.  Skin: warm and dry Neuro: no focal abnormalities noted  EKG:  05/03/14-atrial fibrillation, heart rate 84. 06/28/13-atrial fibrillation rate 79, poor R wave progression, nonspecific ST-T wave changes.     ASSESSMENT AND PLAN:  1. Atrial fibrillation-continuing with rate control. Overall well-controlled. 2. Chronic anticoagulation-continue current medication. Checking lab work every 6 months,  prior creatinine 1.1. Hemoglobin 14.9. 3. Disequilibrium-Previously discontinued her low-dose lisinopril 2.5 mg. may not have helped much. Uses a cane.  4. Hypertension-very well controlled. 5. Dyspnea-improved. Continue with low-dose Lasix, 20 mg.  6. Cardiomyopathy-prior ejection fraction 35%. Currently not on ACE inhibitor because of relative hypotension. Current echocardiogram shows 40%. 7. Moderate mitral regurgitation-we'll continue to monitor. This does not appear severe upon personal review. 8. Obesity-encourage weight loss. 9.  memory impairment- seen neurologist, Dr. Lucia Gaskins.  Signed, Donato Schultz, MD [facility]  11/15/2014 11:13 AM

## 2015-01-13 ENCOUNTER — Other Ambulatory Visit: Payer: Self-pay | Admitting: Cardiology

## 2015-03-14 ENCOUNTER — Telehealth: Payer: Self-pay | Admitting: Cardiology

## 2015-03-14 ENCOUNTER — Ambulatory Visit (INDEPENDENT_AMBULATORY_CARE_PROVIDER_SITE_OTHER): Payer: Medicare Other | Admitting: Cardiology

## 2015-03-14 ENCOUNTER — Encounter: Payer: Self-pay | Admitting: Cardiology

## 2015-03-14 VITALS — BP 130/60 | HR 79 | Ht 64.0 in | Wt 207.0 lb

## 2015-03-14 DIAGNOSIS — R0600 Dyspnea, unspecified: Secondary | ICD-10-CM | POA: Diagnosis not present

## 2015-03-14 DIAGNOSIS — R059 Cough, unspecified: Secondary | ICD-10-CM

## 2015-03-14 DIAGNOSIS — I509 Heart failure, unspecified: Secondary | ICD-10-CM | POA: Diagnosis not present

## 2015-03-14 DIAGNOSIS — R05 Cough: Secondary | ICD-10-CM

## 2015-03-14 DIAGNOSIS — R0602 Shortness of breath: Secondary | ICD-10-CM

## 2015-03-14 LAB — CBC WITH DIFFERENTIAL/PLATELET
Basophils Absolute: 0 10*3/uL (ref 0.0–0.1)
Basophils Relative: 0 % (ref 0–1)
EOS ABS: 0.1 10*3/uL (ref 0.0–0.7)
EOS PCT: 1 % (ref 0–5)
HCT: 42 % (ref 36.0–46.0)
Hemoglobin: 14.8 g/dL (ref 12.0–15.0)
LYMPHS ABS: 1.1 10*3/uL (ref 0.7–4.0)
Lymphocytes Relative: 16 % (ref 12–46)
MCH: 30.8 pg (ref 26.0–34.0)
MCHC: 35.2 g/dL (ref 30.0–36.0)
MCV: 87.3 fL (ref 78.0–100.0)
MONO ABS: 0.7 10*3/uL (ref 0.1–1.0)
MONOS PCT: 10 % (ref 3–12)
MPV: 10.7 fL (ref 8.6–12.4)
NEUTROS ABS: 4.9 10*3/uL (ref 1.7–7.7)
NEUTROS PCT: 73 % (ref 43–77)
PLATELETS: 123 10*3/uL — AB (ref 150–400)
RBC: 4.81 MIL/uL (ref 3.87–5.11)
RDW: 14.1 % (ref 11.5–15.5)
WBC: 6.7 10*3/uL (ref 4.0–10.5)

## 2015-03-14 MED ORDER — FUROSEMIDE 20 MG PO TABS: 20.0000 mg | ORAL_TABLET | Freq: Every day | ORAL | Status: DC

## 2015-03-14 NOTE — Telephone Encounter (Signed)
Pt c/o Shortness Of Breath: STAT if SOB developed within the last 24 hours or pt is noticeably SOB on the phone  1. Are you currently SOB (can you hear that pt is SOB on the phone)? Yes  2. How long have you been experiencing SOB? 3 Days  3. Are you SOB when sitting or when up moving around? Up moving around  4. Are you currently experiencing any other symptoms? Cough

## 2015-03-14 NOTE — Progress Notes (Signed)
03/14/2015 Heidi Miranda   12/13/1932  [id number]  Primary Physician Sissy HoffSWAYNE,DAVID W, MD Primary Cardiologist: Dr. Anne FuSkains   Reason for Visit/CC: Dyspnea  HPI:  81 y.o. female followed by Dr. Anne FuSkains, who presents to [facility] with complaint of worsening dyspnea. She was hospitalized in 02/2012  for atrial fibrillation and newly discovered systolic heart failure. Also with hx of TIA, decrease memory and hypertension. Echocardiogram 06/24/12 with EF 25-30% and severely reduced LV function, grade 3 diastolic dysfunction. During previous hospital admission, she experienced a 2.8 second pause and because of this did not receive digoxin. She also had a 12 beat run of wide-complex tachycardia and her Toprol was increased. ICD was discussed in the past but patient declined. She has also declined undergo a heart catheterization in the past. In 03/2014 she had a f/u echo which showed improvement in EF to 40-45%. She was also noted to have severe MR however, per previous office note, Dr. Anne FuSkains and Dr. Shirlee LatchMcLean both reviewed the study and both felt her MR was only moderate. Plan is to monitor. She is on chronic anticoagulation therapy with Xarelto.  Her last CBC 02/2014 showed stable Hgb at 13.4.   Patient notes increased dyspnea over the last week. Occurs at rest and with exertion. No chest pain or tachy palpitations. She also denies any significant weight gain or edema. She notes a productive cough with clear sputum x 2 weeks. No fever or chills. She also notes nasal congestion and mild occasional wheezing. She has tried OTC allergy medication with little relief.    Current Outpatient Prescriptions  Medication Sig Dispense Refill  . ALPRAZolam (XANAX) 0.25 MG tablet Take 0.25 mg by mouth 3 (three) times daily as needed. For anxiety or dizziness    . atorvastatin (LIPITOR) 20 MG tablet Take 20 mg by mouth every morning.     Marland Kitchen. CALCIUM PO Take 1 tablet by mouth daily.    . Cetirizine HCl (ZYRTEC PO) Take 1  tablet by mouth daily.    . citalopram (CELEXA) 20 MG tablet Take 20 mg by mouth every morning.     . furosemide (LASIX) 20 MG tablet Take 1 tablet (20 mg total) by mouth daily. 30 tablet 5  . Lansoprazole (PREVACID PO) Take 1 tablet by mouth at bedtime.    . metoprolol succinate (TOPROL-XL) 100 MG 24 hr tablet TAKE 1 TABLET BY MOUTH DAILY. TAKE WITH OR IMMEDIATELY FOLLOWING A MEAL. 30 tablet 9  . Multiple Vitamin (MULTIVITAMIN WITH MINERALS) TABS Take 1 tablet by mouth daily.    . verapamil (COVERA HS) 240 MG (CO) 24 hr tablet Take 240 mg by mouth at bedtime.    Carlena Hurl. XARELTO 15 MG TABS tablet TAKE ONE TABLET BY MOUTH ONCE DAILY WITH SUPPER 30 tablet 5   No current facility-administered medications for this visit.    Allergies  Allergen Reactions  . Penicillins Hives    Social History   Social History  . Marital Status: Married    Spouse Name: [name]  . Number of Children: 6  . Years of Education: HS   Occupational History  . Retired    Social History Main Topics  . Smoking status: Never Smoker   . Smokeless tobacco: Never Used  . Alcohol Use: 0.0 oz/week    0 Standard drinks or equivalent per week     Comment: daily  . Drug Use: No  . Sexual Activity: Not on file   Other Topics Concern  . Not on file  Social History Narrative   Pt lives at home with her spouse.   Caffeine Use:      Review of Systems: General: negative for chills, fever, night sweats or weight changes.  Cardiovascular: negative for chest pain, dyspnea on exertion, edema, orthopnea, palpitations, paroxysmal nocturnal dyspnea or shortness of breath Dermatological: negative for rash Respiratory: negative for cough or wheezing Urologic: negative for hematuria Abdominal: negative for nausea, vomiting, diarrhea, bright red blood per rectum, melena, or hematemesis Neurologic: negative for visual changes, syncope, or dizziness All other systems reviewed and are otherwise negative except as noted  above.    Blood pressure 130/60, pulse 79, height  (1.626 m), weight 207 lb (93.895 kg).  General appearance: alert, cooperative, no distress and in a wheel chair Neck: no carotid bruit and no JVD Lungs: no rales or crackles, mild expiratory wheezing Heart: irregularly irregular rhythm and regular rate Extremities: no LEE Pulses: 2+ and symmetric Skin: Skin color, texture, turgor normal. No rashes or lesions Neurologic: Grossly normal  EKG   Atrial fibrillation with a CVR 79 bpm                                                                                                                                             ASSESSMENT AND PLAN:   1. Dyspnea: physical exam is fairly normal with the exception of mild diffuse expiratory wheezing. No rales or crackles present. No significant edema. Her symptoms seem more c/w URI, however given her h/o systolic CHF, will check a BNP. For now she is to continue current dose of lasix. If elevated BNP, will notify patient by phone to increase dose to 40 mg, temporarily. However, if normal BNP would recommend that she f/u with her PCP for recommendations. If normal BNP and if  her dyspnea fails to improve after resolution of URI, then she will need to f/u with Dr. Anne Fu for further recommendations given her h/o moderate MR. Her last echo was 03/2014. She may need f/u echo if continued symptoms. For safe measures, will also check CBC to assess for anemia given she is on chronic anticoagulation with Xarelto.      Heidi Durfee PA-C 03/14/2015 1:05 PM

## 2015-03-14 NOTE — Patient Instructions (Signed)
Medication Instructions:  Your physician recommends that you continue on your current medications as directed. Please refer to the Current Medication list given to you today.  An Rx for Lasix has been sent to your pharmacy  Labwork: Bnp, Bmet, Cbc today  Testing/Procedures: None ordered  Follow-Up: Follow up as planned  Any Other Special Instructions Will Be Listed Below (If Applicable).     If you need a refill on your cardiac medications before your next appointment, please call your pharmacy.

## 2015-03-14 NOTE — Telephone Encounter (Signed)
Spoke with pt and husband. Pt has been having increase SOB for the last 3 days. When pt goes to the restroom she can't hardly breath  when she comes back to her recliner chair . Pt has been having problems  coughing. Pt and husband think that pt has been having fluid build up in her system. Pt denies increase of  LE edema. Pt does not have a way to take her BP. Pt takes Lasix 20 mg once daily. Pt was taking 100 mg Metoprolol once daily, pt's PCP decreased the dose to 50 mg once daily.   Brittainy simmons PA recommends for pt to increased her lasix to 40 mg once daily and for her to be  Seen later this week if she does not get better, or pt can come to be seen today at 1130 AM in her Clinic. Pt and wife would like to be seen today. Pt has an appointment at 11:30 AM today. Pt and husband are aware.

## 2015-03-15 LAB — BASIC METABOLIC PANEL
BUN: 20 mg/dL (ref 7–25)
CALCIUM: 9.7 mg/dL (ref 8.6–10.4)
CO2: 25 mmol/L (ref 20–31)
CREATININE: 1.02 mg/dL — AB (ref 0.60–0.88)
Chloride: 96 mmol/L — ABNORMAL LOW (ref 98–110)
Glucose, Bld: 82 mg/dL (ref 65–99)
Potassium: 4.2 mmol/L (ref 3.5–5.3)
SODIUM: 134 mmol/L — AB (ref 135–146)

## 2015-03-15 LAB — BRAIN NATRIURETIC PEPTIDE: Brain Natriuretic Peptide: 445 pg/mL — ABNORMAL HIGH (ref 0.0–100.0)

## 2015-03-16 ENCOUNTER — Telehealth: Payer: Self-pay | Admitting: *Deleted

## 2015-03-16 DIAGNOSIS — R0602 Shortness of breath: Secondary | ICD-10-CM

## 2015-03-16 DIAGNOSIS — R7989 Other specified abnormal findings of blood chemistry: Secondary | ICD-10-CM

## 2015-03-16 NOTE — Telephone Encounter (Signed)
Notes Recorded by Julio SicksJennifer L Bowers, LPN on 16/10/960412/16/2016 at 4:18 PM Spoke with husband and informed him to have pt increase Lasix to 40mg  QD x 3 days and then go back to 20mg  QD. Advised husband that we will still plan to check those labs at her January appt. Husband verbalized understanding and was in agreement with this plan. Notes Recorded by Julio SicksJennifer L Bowers, LPN on 54/09/811912/16/2016 at 3:12 PM Spoke with pt's husband, Delorise RoyalsClemens, [place]. Informed husband of lab results and new orders.  Husband states that pt plans to leave on Monday to fly out of town for 2 weeks and will not be back in town until 04/04/15. Scheduled pt to f/u on 1/5 with Robbie LisBrittainy Simmons, PA-C. Spoke with Brittainy in regards to pt being out of town and unable to do labs. Brittainy said to have pt increase Lasix to 40mg  x 3 days and then go back to 20mg  QD since we are unable to get labs. Attempted to call husband back but no answer and no voicemail. Will try again later.

## 2015-03-16 NOTE — Telephone Encounter (Signed)
-----   Message from Allayne ButcherBrittainy M Simmons, [place]PA-C sent at 03/16/2015  2:01 PM EST ----- BNP slightly elevated at 445. Her SOB may be related to acute on chronic CHF. Increase lasix to 40 mg daily. F/u BNP and BMP  + office f/u in 1 week.

## 2015-03-27 ENCOUNTER — Ambulatory Visit: Payer: Medicare Other | Admitting: Physician Assistant

## 2015-04-05 ENCOUNTER — Ambulatory Visit: Payer: Medicare Other | Admitting: Cardiology

## 2015-05-10 ENCOUNTER — Other Ambulatory Visit: Payer: Self-pay | Admitting: Cardiology

## 2015-05-15 ENCOUNTER — Ambulatory Visit: Payer: Medicare Other | Admitting: Cardiology

## 2015-12-17 ENCOUNTER — Other Ambulatory Visit: Payer: Self-pay | Admitting: Cardiology

## 2016-01-15 ENCOUNTER — Telehealth: Payer: Self-pay | Admitting: Cardiology

## 2016-01-15 ENCOUNTER — Other Ambulatory Visit: Payer: Self-pay | Admitting: Cardiology

## 2016-01-15 NOTE — Telephone Encounter (Signed)
Ok with me Brian Crenshaw  

## 2016-01-15 NOTE — Telephone Encounter (Signed)
Ok with me as well Donato SchultzMark Skains, MD

## 2016-01-15 NOTE — Telephone Encounter (Signed)
New Message  Pt husband call requesting to switch providers from Dr. Anne FuSkains to Dr. Jens Somrenshaw. Pt husband states it would be easier because he is already a Crenshaw pt. Would it be okay for me to make the switch.

## 2016-01-25 ENCOUNTER — Other Ambulatory Visit: Payer: Self-pay | Admitting: Family Medicine

## 2016-01-25 ENCOUNTER — Ambulatory Visit
Admission: RE | Admit: 2016-01-25 | Discharge: 2016-01-25 | Disposition: A | Discharge status: Home / Self Care | Payer: Medicare Other | Source: Ambulatory Visit | Attending: Family Medicine | Admitting: Family Medicine

## 2016-01-25 DIAGNOSIS — R059 Cough, unspecified: Secondary | ICD-10-CM

## 2016-01-25 DIAGNOSIS — R05 Cough: Secondary | ICD-10-CM

## 2016-01-29 NOTE — Progress Notes (Signed)
HPI: 82 year old female previously followed by Dr. Anne FuSkains for follow-up of atrial fibrillation, systolic congestive heart failure. Patient's initial echocardiogram December 2013 showed ejection fraction 30-35%, grade 3 diastolic dysfunction and mild mitral regurgitation. Patient apparently declined cardiac catheterization to rule out ischemia as a cause of her cardiomyopathy. Last echocardiogram January 2016 showed ejection fraction 40-45% and per Dr. Anne FuSkains moderate mitral regurgitation. There was moderate tricuspid regurgitation and mild biatrial enlargement. Her atrial fibrillation is treated with rate control and anticoagulation.Since last seen, chest pain, palpitations or syncope. She has some dizziness with standing. There is no pedal edema or bleeding. She was out [place] and apparently had what sounds to be a TIA. Records are not available.  Current Outpatient Prescriptions  Medication Sig Dispense Refill  . atorvastatin (LIPITOR) 20 MG tablet Take 20 mg by mouth every morning.     Marland Kitchen. CALCIUM PO Take 1 tablet by mouth daily.    . Cetirizine HCl (ZYRTEC PO) Take 1 tablet by mouth daily.    . citalopram (CELEXA) 20 MG tablet Take 20 mg by mouth every morning.     . furosemide (LASIX) 20 MG tablet TAKE 1 TABLET BY MOUTH EVERY DAY 30 tablet 2  . Lansoprazole (PREVACID PO) Take 1 tablet by mouth at bedtime.    . metoprolol succinate (TOPROL-XL) 100 MG 24 hr tablet TAKE 1 TABLET BY MOUTH DAILY. TAKE WITH OR IMMEDIATELY FOLLOWING A MEAL. 30 tablet 9  . Multiple Vitamin (MULTIVITAMIN WITH MINERALS) TABS Take 1 tablet by mouth daily.    . verapamil (COVERA HS) 240 MG (CO) 24 hr tablet Take 240 mg by mouth at bedtime.    Carlena Hurl. XARELTO 15 MG TABS tablet TAKE ONE TABLET BY MOUTH ONCE DAILY WITH  SUPPER 30 tablet 9  . XARELTO 15 MG TABS tablet TAKE ONE TABLET BY MOUTH ONCE DAILY WITH  SUPPER 30 tablet 1   No current facility-administered medications for this visit.      Past Medical History:    Diagnosis Date  . Anxiety and depression   . Atrial fibrillation (HCC)   . Back pain   . Chronic kidney disease   . Chronic neck pain   . CVA (cerebral infarction)    , right parietal region  . GERD (gastroesophageal reflux disease)   . Hemorrhoids   . Hyperlipidemia   . Hypertension   . Hypertension   . OA (osteoarthritis) of knee   . Sleep apnea    , (Split 02/10/06 AHI 24/hr, O2 mi n 89%; CPAP 7 cm water pressure)  . TIA (transient ischemic attack)     Past Surgical History:  Procedure Laterality Date  . KNEE SURGERY      Social History   Social History  . Marital status: Married    Spouse name: [name]  . Number of children: 6  . Years of education: HS   Occupational History  . Retired    Social History Main Topics  . Smoking status: Never Smoker  . Smokeless tobacco: Never Used  . Alcohol use 0.0 oz/week     Comment: daily  . Drug use: No  . Sexual activity: Not on file   Other Topics Concern  . Not on file   Social History Narrative   Pt lives at home with her spouse.   Caffeine Use:     Family History  Problem Relation Age of Onset  . Diabetes Brother   . Heart attack Brother   . Alcohol abuse  Brother   . Hypertension Mother   . Breast cancer Mother   . Heart attack Father   . Alcohol abuse Father   . Hypertension Sister   . Stroke Maternal Grandmother   . Stroke Paternal Grandmother   . Hypertension Sister     ROS: no fevers or chills, productive cough, hemoptysis, dysphasia, odynophagia, melena, hematochezia, dysuria, hematuria, rash, seizure activity, orthopnea, PND, pedal edema, claudication. Remaining systems are negative.  Physical Exam: Well-developed well-nourished in no acute distress.  Skin is warm and dry.  HEENT is normal.  Neck is supple.  Chest is clear to auscultation with normal expansion.  Cardiovascular exam is irregular Abdominal exam nontender or distended. No masses palpated. Extremities show no edema. neuro  grossly intact  ECG-Atrial fibrillation at a rate of 72. Occasional PVC or aberrantly conducted beat. Normal axis. Cannot rule out prior septal infarct.  A/P  1 Permanent atrial fibrillation-patient is taking 50 of Toprol and 120 verapamil for rate control. I would like to consolidate this. Discontinue verapamil particularly in light of cardiomyopathy. Increase Toprol to 100 mg daily. In one week we will schedule a 24-hour Holter monitor to make sure that her rate is adequately controlled. Continue xarelto. Check hemoglobin and renal function.  2 hypertension-blood pressure controlled. Continue present medications.  3 chronic combined systolic and diastolic dysfunction-continue present dose of Lasix. Check potassium and renal function. Check BNP.   4 moderate mitral regurgitation-we will repeat echocardiogram.  5 cardiomyopathy-continue beta blocker. She is not on an ACE inhibitor because of borderline blood pressure.She also has some orthostatic symptoms. I will consider adding this later. Declined ischemia evaluation previously.   6 Hyperlipidemia-continue statin.  Olga MillersBrian Sequoyah Ramone, MD

## 2016-02-04 ENCOUNTER — Ambulatory Visit (INDEPENDENT_AMBULATORY_CARE_PROVIDER_SITE_OTHER): Payer: Medicare Other | Admitting: Cardiology

## 2016-02-04 ENCOUNTER — Encounter: Payer: Self-pay | Admitting: Cardiology

## 2016-02-04 VITALS — BP 134/80 | HR 72 | Ht 64.0 in | Wt 181.0 lb

## 2016-02-04 DIAGNOSIS — I4891 Unspecified atrial fibrillation: Secondary | ICD-10-CM

## 2016-02-04 DIAGNOSIS — Z23 Encounter for immunization: Secondary | ICD-10-CM

## 2016-02-04 DIAGNOSIS — R0602 Shortness of breath: Secondary | ICD-10-CM | POA: Diagnosis not present

## 2016-02-04 DIAGNOSIS — I059 Rheumatic mitral valve disease, unspecified: Secondary | ICD-10-CM

## 2016-02-04 MED ORDER — METOPROLOL SUCCINATE ER 100 MG PO TB24: ORAL_TABLET | ORAL | 3 refills | Status: DC

## 2016-02-04 NOTE — Patient Instructions (Signed)
Medication Instructions:   STOP VERAPAMIL  INCREASE METOPROLOL SUCC TO 100 MG ONCE DAILY  Labwork:  Your physician recommends that you return for lab work SAME DAY AS ECHO AT THE [address] [facility]  Testing/Procedures:  Your physician has requested that you have an echocardiogram. Echocardiography is a painless test that uses sound waves to create images of your heart. It provides your doctor with information about the size and shape of your heart and how well your heart's chambers and valves are working. This procedure takes approximately one hour. There are no restrictions for this procedure.   Your physician has recommended that you wear a holter monitor. Holter monitors are medical devices that record the heart's electrical activity. Doctors most often use these monitors to diagnose arrhythmias. Arrhythmias are problems with the speed or rhythm of the heartbeat. The monitor is a small, portable device. You can wear one while you do your normal daily activities. This is usually used to diagnose what is causing palpitations/syncope (passing out).    Follow-Up:  Your physician recommends that you schedule a follow-up appointment in: 8 WEEKS WITH DR Jens SomRENSHAW

## 2016-02-14 ENCOUNTER — Telehealth: Payer: Self-pay | Admitting: Cardiology

## 2016-02-14 ENCOUNTER — Ambulatory Visit: Payer: Medicare Other | Admitting: Cardiology

## 2016-02-14 NOTE — Telephone Encounter (Signed)
Pt's husband requesting refill of [facility] Succinate at [facility]  [phone]

## 2016-02-15 NOTE — Telephone Encounter (Signed)
Call pt back letting her know medications is at pharmacy for pick

## 2016-02-25 ENCOUNTER — Other Ambulatory Visit: Payer: Medicare Other

## 2016-02-25 ENCOUNTER — Other Ambulatory Visit (HOSPITAL_COMMUNITY): Payer: Medicare Other

## 2016-02-25 ENCOUNTER — Other Ambulatory Visit: Payer: Self-pay | Admitting: Cardiology

## 2016-02-25 ENCOUNTER — Encounter: Payer: Self-pay | Admitting: *Deleted

## 2016-02-25 LAB — CBC WITH DIFFERENTIAL/PLATELET
BASOS PCT: 0 %
Basophils Absolute: 0 cells/uL (ref 0–200)
EOS ABS: 216 {cells}/uL (ref 15–500)
Eosinophils Relative: 3 %
HEMATOCRIT: 41.2 % (ref 35.0–45.0)
HEMOGLOBIN: 13.7 g/dL (ref 11.7–15.5)
LYMPHS ABS: 1800 {cells}/uL (ref 850–3900)
Lymphocytes Relative: 25 %
MCH: 31.1 pg (ref 27.0–33.0)
MCHC: 33.3 g/dL (ref 32.0–36.0)
MCV: 93.4 fL (ref 80.0–100.0)
MONO ABS: 432 {cells}/uL (ref 200–950)
MPV: 11.1 fL (ref 7.5–12.5)
Monocytes Relative: 6 %
NEUTROS ABS: 4752 {cells}/uL (ref 1500–7800)
Neutrophils Relative %: 66 %
Platelets: 152 10*3/uL (ref 140–400)
RBC: 4.41 MIL/uL (ref 3.80–5.10)
RDW: 13.3 % (ref 11.0–15.0)
WBC: 7.2 10*3/uL (ref 3.8–10.8)

## 2016-02-25 LAB — BASIC METABOLIC PANEL
BUN: 22 mg/dL (ref 7–25)
CHLORIDE: 102 mmol/L (ref 98–110)
CO2: 28 mmol/L (ref 20–31)
Calcium: 9.3 mg/dL (ref 8.6–10.4)
Creat: 1.14 mg/dL — ABNORMAL HIGH (ref 0.60–0.88)
GLUCOSE: 84 mg/dL (ref 65–99)
POTASSIUM: 4.5 mmol/L (ref 3.5–5.3)
Sodium: 140 mmol/L (ref 135–146)

## 2016-02-25 NOTE — Progress Notes (Signed)
Patient ID: Heidi Miranda, female   DOB: 06/23/1932, 82 y.o.   MRN: [id number] Patient did not show up for 02/25/16, 2:30 PM, appointment to have a 24 hour holter monitor applied.

## 2016-02-26 ENCOUNTER — Telehealth: Payer: Self-pay | Admitting: Cardiology

## 2016-02-26 LAB — BRAIN NATRIURETIC PEPTIDE: Brain Natriuretic Peptide: 246.8 pg/mL — ABNORMAL HIGH (ref <100)

## 2016-02-26 NOTE — Telephone Encounter (Signed)
Pt husband is calling because his wife is suppose to get a monitor but they dont know when

## 2016-02-26 NOTE — Telephone Encounter (Signed)
Patient did not show up for 02/25/16, 2:30 PM, appointment to have a 24 hour holter monitor applied.

## 2016-03-12 ENCOUNTER — Ambulatory Visit (INDEPENDENT_AMBULATORY_CARE_PROVIDER_SITE_OTHER): Payer: Medicare Other

## 2016-03-12 DIAGNOSIS — I4891 Unspecified atrial fibrillation: Secondary | ICD-10-CM

## 2016-03-15 ENCOUNTER — Other Ambulatory Visit: Payer: Self-pay | Admitting: Cardiology

## 2016-03-18 ENCOUNTER — Other Ambulatory Visit: Payer: Medicare Other | Admitting: *Deleted

## 2016-03-18 ENCOUNTER — Other Ambulatory Visit: Payer: Self-pay

## 2016-03-18 ENCOUNTER — Ambulatory Visit (HOSPITAL_COMMUNITY): Payer: Medicare Other | Attending: Cardiovascular Disease

## 2016-03-18 DIAGNOSIS — I340 Nonrheumatic mitral (valve) insufficiency: Secondary | ICD-10-CM | POA: Insufficient documentation

## 2016-03-18 DIAGNOSIS — I351 Nonrheumatic aortic (valve) insufficiency: Secondary | ICD-10-CM | POA: Insufficient documentation

## 2016-03-18 DIAGNOSIS — I509 Heart failure, unspecified: Secondary | ICD-10-CM | POA: Diagnosis not present

## 2016-03-18 DIAGNOSIS — I059 Rheumatic mitral valve disease, unspecified: Secondary | ICD-10-CM | POA: Diagnosis not present

## 2016-03-18 DIAGNOSIS — E785 Hyperlipidemia, unspecified: Secondary | ICD-10-CM | POA: Insufficient documentation

## 2016-03-18 DIAGNOSIS — I77810 Thoracic aortic ectasia: Secondary | ICD-10-CM | POA: Insufficient documentation

## 2016-03-18 DIAGNOSIS — I110 Hypertensive heart disease with heart failure: Secondary | ICD-10-CM | POA: Insufficient documentation

## 2016-03-18 DIAGNOSIS — I071 Rheumatic tricuspid insufficiency: Secondary | ICD-10-CM | POA: Diagnosis not present

## 2016-03-20 ENCOUNTER — Telehealth: Payer: Self-pay | Admitting: *Deleted

## 2016-03-20 MED ORDER — FUROSEMIDE 20 MG PO TABS: 20.0000 mg | ORAL_TABLET | Freq: Every day | ORAL | 3 refills | Status: DC

## 2016-03-20 MED ORDER — METOPROLOL SUCCINATE ER 25 MG PO TB24: 75.0000 mg | ORAL_TABLET | Freq: Every day | ORAL | 3 refills | Status: DC

## 2016-03-20 NOTE — Telephone Encounter (Signed)
Results and medication changes discussed with patient's husband. The patient is [place]. New script sent to the pharmacy and the patient will need to pick up where she is currently.

## 2016-03-20 NOTE — Telephone Encounter (Signed)
-----   Message from Lewayne BuntingBrian S Crenshaw, MD sent at 03/18/2016  2:14 PM EST ----- Decrease metoprolol to 75 mg daily Olga MillersBrian Crenshaw

## 2016-03-30 NOTE — Progress Notes (Signed)
HPI: FU atrial fibrillation, systolic congestive heart failure. Patient's initial echocardiogram December 2013 showed ejection fraction 30-35%, grade 3 diastolic dysfunction and mild mitral regurgitation. Patient apparently declined cardiac catheterization to rule out ischemia as a cause of her cardiomyopathy. Her atrial fibrillation is treated with rate control and anticoagulation. Last echo 12/17 showed EF 30-35, mild to moderate AI, mild MR, severe LAE, moderate RAE. Holter 12/17 showed afib rate mildly decreased and metoprolol reduced. Since last seen, she denies dyspnea, chest pain, palpitations or syncope. She has significant orthostatic symptoms.  Current Outpatient Prescriptions  Medication Sig Dispense Refill  . atorvastatin (LIPITOR) 20 MG tablet Take 20 mg by mouth every morning.     Marland Kitchen. CALCIUM PO Take 1 tablet by mouth daily.    . Cetirizine HCl (ZYRTEC PO) Take 1 tablet by mouth daily.    . citalopram (CELEXA) 20 MG tablet Take 20 mg by mouth every morning.     . furosemide (LASIX) 20 MG tablet Take 1 tablet (20 mg total) by mouth daily. 90 tablet 3  . Lansoprazole (PREVACID PO) Take 1 tablet by mouth at bedtime.    . metoprolol succinate (TOPROL-XL) 25 MG 24 hr tablet Take 3 tablets (75 mg total) by mouth daily. 270 tablet 3  . Multiple Vitamin (MULTIVITAMIN WITH MINERALS) TABS Take 1 tablet by mouth daily.    Carlena Hurl. XARELTO 15 MG TABS tablet TAKE ONE TABLET BY MOUTH ONCE DAILY WITH SUPPER 30 tablet 5   No current facility-administered medications for this visit.      Past Medical History:  Diagnosis Date  . Anxiety and depression   . Atrial fibrillation (HCC)   . Back pain   . Chronic kidney disease   . Chronic neck pain   . CVA (cerebral infarction)    , right parietal region  . GERD (gastroesophageal reflux disease)   . Hemorrhoids   . Hyperlipidemia   . Hypertension   . Hypertension   . OA (osteoarthritis) of knee   . Sleep apnea    , (Split 02/10/06 AHI 24/hr,  O2 mi n 89%; CPAP 7 cm water pressure)  . TIA (transient ischemic attack)     Past Surgical History:  Procedure Laterality Date  . KNEE SURGERY      Social History   Social History  . Marital status: Married    Spouse name: [name]  . Number of children: 6  . Years of education: HS   Occupational History  . Retired    Social History Main Topics  . Smoking status: Never Smoker  . Smokeless tobacco: Never Used  . Alcohol use 0.0 oz/week     Comment: daily  . Drug use: No  . Sexual activity: Not on file   Other Topics Concern  . Not on file   Social History Narrative   Pt lives at home with her spouse.   Caffeine Use:     Family History  Problem Relation Age of Onset  . Diabetes Brother   . Heart attack Brother   . Alcohol abuse Brother   . Hypertension Mother   . Breast cancer Mother   . Heart attack Father   . Alcohol abuse Father   . Hypertension Sister   . Stroke Maternal Grandmother   . Stroke Paternal Grandmother   . Hypertension Sister     ROS: no fevers or chills, productive cough, hemoptysis, dysphasia, odynophagia, melena, hematochezia, dysuria, hematuria, rash, seizure activity, orthopnea, PND, pedal edema, claudication.  Remaining systems are negative.  Physical Exam: Well-developed well-nourished in no acute distress.  Skin is warm and dry.  HEENT is normal.  Neck is supple.  Chest is clear to auscultation with normal expansion.  Cardiovascular exam is regular rate and rhythm.  Abdominal exam nontender or distended. No masses palpated. Extremities show no edema. neuro grossly intact  A/P  1 Permanent atrial fibrillation-continue toprol. Apixaban cheaper for them; DC xarelto and treate with apixaban 5 mg BID.  2 hypertension-blood pressure controlled. Continue present medications.  3 chronic combined systolic and diastolic dysfunction-continue present dose of Lasix.   4 Mild MR and mild to moderate AI  5 cardiomyopathy-continue beta  blocker. She is not on an ACE inhibitor as she has significant orthostatic symptoms; I will not add as I am concerned about worsening these symptoms and possibly precipitating syncope. Declined ischemia evaluation previously.   6 Hyperlipidemia-continue statin.  Olga MillersBrian Ashea Winiarski, MD

## 2016-04-04 ENCOUNTER — Encounter: Payer: Self-pay | Admitting: Cardiology

## 2016-04-04 ENCOUNTER — Ambulatory Visit (INDEPENDENT_AMBULATORY_CARE_PROVIDER_SITE_OTHER): Payer: Medicare Other | Admitting: Cardiology

## 2016-04-04 VITALS — BP 142/74 | HR 60 | Ht 64.0 in | Wt 187.0 lb

## 2016-04-04 DIAGNOSIS — I10 Essential (primary) hypertension: Secondary | ICD-10-CM | POA: Diagnosis not present

## 2016-04-04 DIAGNOSIS — I4891 Unspecified atrial fibrillation: Secondary | ICD-10-CM | POA: Diagnosis not present

## 2016-04-04 MED ORDER — APIXABAN 5 MG PO TABS: 5.0000 mg | ORAL_TABLET | Freq: Two times a day (BID) | ORAL | 6 refills | Status: DC

## 2016-04-04 NOTE — Patient Instructions (Signed)
Medication Instructions:   STOP XARELTO  START ELIQUIS 5 MG ONE TABLET TWICE DAILY  Follow-Up:  Your physician wants you to follow-up in: 6 MONTHS WITH DR Jens SomRENSHAW You will receive a reminder letter in the mail two months in advance. If you don't receive a letter, please call our office to schedule the follow-up appointment.   If you need a refill on your cardiac medications before your next appointment, please call your pharmacy.

## 2016-04-08 ENCOUNTER — Telehealth: Payer: Self-pay | Admitting: Cardiology

## 2016-04-08 MED ORDER — ATORVASTATIN CALCIUM 20 MG PO TABS: 20.0000 mg | ORAL_TABLET | Freq: Every morning | ORAL | 1 refills | Status: DC

## 2016-04-08 NOTE — Telephone Encounter (Signed)
°*  STAT* If patient is at the pharmacy, call can be transferred to refill team.   1. Which medications need to be refilled? (please list name of each medication and dose if known) Lipitor  2. Which pharmacy/location (including street and city if local pharmacy) is medication to be sent to?[facility] on [address]   3. Do they need a 30 day or 90 day supply? 90

## 2016-04-08 NOTE — Telephone Encounter (Signed)
rx refilled.

## 2016-06-23 ENCOUNTER — Encounter (HOSPITAL_COMMUNITY): Payer: Self-pay | Admitting: Emergency Medicine

## 2016-06-23 ENCOUNTER — Emergency Department (HOSPITAL_COMMUNITY)
Admission: EM | Admit: 2016-06-23 | Discharge: 2016-06-23 | Disposition: A | Discharge status: Home / Self Care | Payer: Medicare Other | Source: Home / Self Care | Attending: Emergency Medicine | Admitting: Emergency Medicine

## 2016-06-23 ENCOUNTER — Emergency Department (HOSPITAL_COMMUNITY): Payer: Medicare Other

## 2016-06-23 DIAGNOSIS — I509 Heart failure, unspecified: Secondary | ICD-10-CM

## 2016-06-23 DIAGNOSIS — Y939 Activity, unspecified: Secondary | ICD-10-CM

## 2016-06-23 DIAGNOSIS — Z79899 Other long term (current) drug therapy: Secondary | ICD-10-CM

## 2016-06-23 DIAGNOSIS — Z7901 Long term (current) use of anticoagulants: Secondary | ICD-10-CM

## 2016-06-23 DIAGNOSIS — W19XXXA Unspecified fall, initial encounter: Secondary | ICD-10-CM

## 2016-06-23 DIAGNOSIS — W1830XA Fall on same level, unspecified, initial encounter: Secondary | ICD-10-CM

## 2016-06-23 DIAGNOSIS — S9031XA Contusion of right foot, initial encounter: Secondary | ICD-10-CM | POA: Insufficient documentation

## 2016-06-23 DIAGNOSIS — I130 Hypertensive heart and chronic kidney disease with heart failure and stage 1 through stage 4 chronic kidney disease, or unspecified chronic kidney disease: Secondary | ICD-10-CM | POA: Insufficient documentation

## 2016-06-23 DIAGNOSIS — Z8673 Personal history of transient ischemic attack (TIA), and cerebral infarction without residual deficits: Secondary | ICD-10-CM | POA: Insufficient documentation

## 2016-06-23 DIAGNOSIS — N189 Chronic kidney disease, unspecified: Secondary | ICD-10-CM | POA: Insufficient documentation

## 2016-06-23 DIAGNOSIS — R51 Headache: Secondary | ICD-10-CM

## 2016-06-23 DIAGNOSIS — Y92009 Unspecified place in unspecified non-institutional (private) residence as the place of occurrence of the external cause: Secondary | ICD-10-CM | POA: Insufficient documentation

## 2016-06-23 DIAGNOSIS — I63529 Cerebral infarction due to unspecified occlusion or stenosis of unspecified anterior cerebral artery: Secondary | ICD-10-CM | POA: Diagnosis not present

## 2016-06-23 DIAGNOSIS — Y999 Unspecified external cause status: Secondary | ICD-10-CM | POA: Insufficient documentation

## 2016-06-23 DIAGNOSIS — R569 Unspecified convulsions: Secondary | ICD-10-CM | POA: Diagnosis not present

## 2016-06-23 LAB — URINALYSIS, ROUTINE W REFLEX MICROSCOPIC
Bilirubin Urine: NEGATIVE
Glucose, UA: NEGATIVE mg/dL
Ketones, ur: NEGATIVE mg/dL
Leukocytes, UA: NEGATIVE
Nitrite: POSITIVE — AB
Protein, ur: NEGATIVE mg/dL
Specific Gravity, Urine: 1.023 (ref 1.005–1.030)
pH: 5 (ref 5.0–8.0)

## 2016-06-23 MED ORDER — CIPROFLOXACIN HCL 500 MG PO TABS
500.0000 mg | ORAL_TABLET | Freq: Once | ORAL | Status: AC
  Administered 2016-06-23: 500 mg via ORAL
  Filled 2016-06-23: qty 1

## 2016-06-23 MED ORDER — CIPROFLOXACIN HCL 500 MG PO TABS: 500.0000 mg | ORAL_TABLET | Freq: Two times a day (BID) | ORAL | 0 refills | Status: DC
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
## 2016-06-23 NOTE — ED Notes (Signed)
Pt given sprite to drink per Tobi BastosAnna, RN

## 2016-06-23 NOTE — ED Notes (Signed)
Patient returned from xray.

## 2016-06-23 NOTE — ED Notes (Signed)
Patient transported to X-ray 
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
## 2016-06-23 NOTE — ED Notes (Signed)
Informed Dr. Juleen ChinaKohut that pt's husband was requesting an x-ray be taken of the pt's right ankle/foot.

## 2016-06-23 NOTE — ED Notes (Signed)
Pt transported to xray 

## 2016-06-24 LAB — URINE CULTURE

## 2016-06-25 ENCOUNTER — Emergency Department (HOSPITAL_COMMUNITY): Payer: Medicare Other

## 2016-06-25 ENCOUNTER — Observation Stay (HOSPITAL_COMMUNITY): Payer: Medicare Other

## 2016-06-25 ENCOUNTER — Inpatient Hospital Stay (HOSPITAL_COMMUNITY)
Admission: EM | Admit: 2016-06-25 | Discharge: 2016-06-30 | DRG: 064 | Disposition: A | Discharge status: Skilled Nursing Facility | Payer: Medicare Other | Attending: Internal Medicine | Admitting: Internal Medicine

## 2016-06-25 DIAGNOSIS — F0280 Dementia in other diseases classified elsewhere without behavioral disturbance: Secondary | ICD-10-CM

## 2016-06-25 DIAGNOSIS — I4891 Unspecified atrial fibrillation: Secondary | ICD-10-CM | POA: Diagnosis present

## 2016-06-25 DIAGNOSIS — I63529 Cerebral infarction due to unspecified occlusion or stenosis of unspecified anterior cerebral artery: Principal | ICD-10-CM | POA: Diagnosis present

## 2016-06-25 DIAGNOSIS — Z880 Allergy status to penicillin: Secondary | ICD-10-CM

## 2016-06-25 DIAGNOSIS — R1310 Dysphagia, unspecified: Secondary | ICD-10-CM | POA: Diagnosis present

## 2016-06-25 DIAGNOSIS — I481 Persistent atrial fibrillation: Secondary | ICD-10-CM

## 2016-06-25 DIAGNOSIS — G301 Alzheimer's disease with late onset: Secondary | ICD-10-CM

## 2016-06-25 DIAGNOSIS — N189 Chronic kidney disease, unspecified: Secondary | ICD-10-CM | POA: Diagnosis present

## 2016-06-25 DIAGNOSIS — G9340 Encephalopathy, unspecified: Secondary | ICD-10-CM | POA: Diagnosis present

## 2016-06-25 DIAGNOSIS — Z7901 Long term (current) use of anticoagulants: Secondary | ICD-10-CM

## 2016-06-25 DIAGNOSIS — G8384 Todd's paralysis (postepileptic): Secondary | ICD-10-CM | POA: Diagnosis not present

## 2016-06-25 DIAGNOSIS — G8191 Hemiplegia, unspecified affecting right dominant side: Secondary | ICD-10-CM | POA: Diagnosis present

## 2016-06-25 DIAGNOSIS — I639 Cerebral infarction, unspecified: Secondary | ICD-10-CM | POA: Diagnosis present

## 2016-06-25 DIAGNOSIS — R29810 Facial weakness: Secondary | ICD-10-CM | POA: Diagnosis present

## 2016-06-25 DIAGNOSIS — I10 Essential (primary) hypertension: Secondary | ICD-10-CM | POA: Diagnosis present

## 2016-06-25 DIAGNOSIS — I6523 Occlusion and stenosis of bilateral carotid arteries: Secondary | ICD-10-CM

## 2016-06-25 DIAGNOSIS — I482 Chronic atrial fibrillation: Secondary | ICD-10-CM | POA: Diagnosis present

## 2016-06-25 DIAGNOSIS — I129 Hypertensive chronic kidney disease with stage 1 through stage 4 chronic kidney disease, or unspecified chronic kidney disease: Secondary | ICD-10-CM | POA: Diagnosis present

## 2016-06-25 DIAGNOSIS — R569 Unspecified convulsions: Secondary | ICD-10-CM | POA: Diagnosis not present

## 2016-06-25 DIAGNOSIS — E1122 Type 2 diabetes mellitus with diabetic chronic kidney disease: Secondary | ICD-10-CM | POA: Diagnosis present

## 2016-06-25 DIAGNOSIS — Z8673 Personal history of transient ischemic attack (TIA), and cerebral infarction without residual deficits: Secondary | ICD-10-CM

## 2016-06-25 DIAGNOSIS — R531 Weakness: Secondary | ICD-10-CM

## 2016-06-25 DIAGNOSIS — E785 Hyperlipidemia, unspecified: Secondary | ICD-10-CM | POA: Diagnosis present

## 2016-06-25 DIAGNOSIS — R4189 Other symptoms and signs involving cognitive functions and awareness: Secondary | ICD-10-CM | POA: Diagnosis not present

## 2016-06-25 DIAGNOSIS — E876 Hypokalemia: Secondary | ICD-10-CM | POA: Diagnosis present

## 2016-06-25 DIAGNOSIS — F329 Major depressive disorder, single episode, unspecified: Secondary | ICD-10-CM | POA: Diagnosis present

## 2016-06-25 DIAGNOSIS — F0390 Unspecified dementia without behavioral disturbance: Secondary | ICD-10-CM | POA: Diagnosis present

## 2016-06-25 DIAGNOSIS — Z66 Do not resuscitate: Secondary | ICD-10-CM | POA: Diagnosis present

## 2016-06-25 DIAGNOSIS — Z79899 Other long term (current) drug therapy: Secondary | ICD-10-CM

## 2016-06-25 DIAGNOSIS — F419 Anxiety disorder, unspecified: Secondary | ICD-10-CM | POA: Diagnosis present

## 2016-06-25 DIAGNOSIS — G4733 Obstructive sleep apnea (adult) (pediatric): Secondary | ICD-10-CM | POA: Diagnosis present

## 2016-06-25 DIAGNOSIS — K219 Gastro-esophageal reflux disease without esophagitis: Secondary | ICD-10-CM | POA: Diagnosis present

## 2016-06-25 DIAGNOSIS — G4089 Other seizures: Secondary | ICD-10-CM | POA: Diagnosis present

## 2016-06-25 HISTORY — DX: Prediabetes: R73.03

## 2016-06-25 HISTORY — DX: Unspecified dementia, unspecified severity, without behavioral disturbance, psychotic disturbance, mood disturbance, and anxiety: F03.90

## 2016-06-25 LAB — RAPID URINE DRUG SCREEN, HOSP PERFORMED
AMPHETAMINES: NOT DETECTED
BARBITURATES: NOT DETECTED
BENZODIAZEPINES: NOT DETECTED
COCAINE: NOT DETECTED
Opiates: NOT DETECTED
TETRAHYDROCANNABINOL: NOT DETECTED

## 2016-06-25 LAB — COMPREHENSIVE METABOLIC PANEL
ALBUMIN: 3.5 g/dL (ref 3.5–5.0)
ALT: 18 U/L (ref 14–54)
AST: 24 U/L (ref 15–41)
Alkaline Phosphatase: 90 U/L (ref 38–126)
Anion gap: 9 (ref 5–15)
BUN: 10 mg/dL (ref 6–20)
CHLORIDE: 109 mmol/L (ref 101–111)
CO2: 23 mmol/L (ref 22–32)
CREATININE: 1.2 mg/dL — AB (ref 0.44–1.00)
Calcium: 8.8 mg/dL — ABNORMAL LOW (ref 8.9–10.3)
GFR calc Af Amer: 48 mL/min — ABNORMAL LOW (ref >60)
GFR, EST NON AFRICAN AMERICAN: 41 mL/min — AB (ref >60)
GLUCOSE: 122 mg/dL — AB (ref 65–99)
POTASSIUM: 3.9 mmol/L (ref 3.5–5.1)
Sodium: 141 mmol/L (ref 135–145)
Total Bilirubin: 1 mg/dL (ref 0.3–1.2)
Total Protein: 6 g/dL — ABNORMAL LOW (ref 6.5–8.1)

## 2016-06-25 LAB — URINALYSIS, ROUTINE W REFLEX MICROSCOPIC
BILIRUBIN URINE: NEGATIVE
GLUCOSE, UA: NEGATIVE mg/dL
HGB URINE DIPSTICK: NEGATIVE
KETONES UR: NEGATIVE mg/dL
Leukocytes, UA: NEGATIVE
Nitrite: NEGATIVE
PROTEIN: NEGATIVE mg/dL
Specific Gravity, Urine: 1.023 (ref 1.005–1.030)
pH: 5 (ref 5.0–8.0)

## 2016-06-25 LAB — PROTIME-INR
INR: 1.44
INR: 1.34
PROTHROMBIN TIME: 17.7 s — AB (ref 11.4–15.2)
Prothrombin Time: 16.7 seconds — ABNORMAL HIGH (ref 11.4–15.2)

## 2016-06-25 LAB — APTT: APTT: 38 s — AB (ref 24–36)

## 2016-06-25 LAB — I-STAT CHEM 8, ED
BUN: 12 mg/dL (ref 6–20)
CHLORIDE: 106 mmol/L (ref 101–111)
Calcium, Ion: 1.1 mmol/L — ABNORMAL LOW (ref 1.15–1.40)
Creatinine, Ser: 1.2 mg/dL — ABNORMAL HIGH (ref 0.44–1.00)
Glucose, Bld: 121 mg/dL — ABNORMAL HIGH (ref 65–99)
HCT: 37 % (ref 36.0–46.0)
HEMOGLOBIN: 12.6 g/dL (ref 12.0–15.0)
POTASSIUM: 3.9 mmol/L (ref 3.5–5.1)
SODIUM: 144 mmol/L (ref 135–145)
TCO2: 24 mmol/L (ref 0–100)

## 2016-06-25 LAB — TSH: TSH: 1.717 u[IU]/mL (ref 0.350–4.500)

## 2016-06-25 LAB — I-STAT TROPONIN, ED: TROPONIN I, POC: 0.02 ng/mL (ref 0.00–0.08)

## 2016-06-25 LAB — DIFFERENTIAL
BASOS ABS: 0 10*3/uL (ref 0.0–0.1)
BASOS PCT: 0 %
EOS ABS: 0.2 10*3/uL (ref 0.0–0.7)
Eosinophils Relative: 2 %
Lymphocytes Relative: 16 %
Lymphs Abs: 1.3 10*3/uL (ref 0.7–4.0)
MONOS PCT: 7 %
Monocytes Absolute: 0.6 10*3/uL (ref 0.1–1.0)
NEUTROS ABS: 6 10*3/uL (ref 1.7–7.7)
Neutrophils Relative %: 75 %

## 2016-06-25 LAB — CBC
HEMATOCRIT: 39.6 % (ref 36.0–46.0)
Hemoglobin: 13.3 g/dL (ref 12.0–15.0)
MCH: 30.9 pg (ref 26.0–34.0)
MCHC: 33.6 g/dL (ref 30.0–36.0)
MCV: 91.9 fL (ref 78.0–100.0)
PLATELETS: 140 10*3/uL — AB (ref 150–400)
RBC: 4.31 MIL/uL (ref 3.87–5.11)
RDW: 13 % (ref 11.5–15.5)
WBC: 8 10*3/uL (ref 4.0–10.5)

## 2016-06-25 LAB — ETHANOL

## 2016-06-25 LAB — MRSA PCR SCREENING: MRSA by PCR: NEGATIVE

## 2016-06-25 MED ORDER — ATORVASTATIN CALCIUM 10 MG PO TABS
20.0000 mg | ORAL_TABLET | Freq: Every day | ORAL | Status: DC
  Administered 2016-06-26 – 2016-06-29 (×4): 20 mg via ORAL
  Filled 2016-06-25 (×2): qty 1
  Filled 2016-06-25 (×2): qty 2

## 2016-06-25 MED ORDER — APIXABAN 5 MG PO TABS
5.0000 mg | ORAL_TABLET | Freq: Two times a day (BID) | ORAL | Status: DC
  Administered 2016-06-26 – 2016-06-30 (×9): 5 mg via ORAL
  Filled 2016-06-25 (×9): qty 1

## 2016-06-25 MED ORDER — LORAZEPAM 2 MG/ML IJ SOLN
1.0000 mg | INTRAMUSCULAR | Status: DC | PRN
  Administered 2016-06-27: 2 mg via INTRAVENOUS
  Filled 2016-06-25: qty 1

## 2016-06-25 MED ORDER — ACETAMINOPHEN 650 MG RE SUPP: 650.0000 mg | Freq: Four times a day (QID) | RECTAL | Status: DC | PRN

## 2016-06-25 MED ORDER — STROKE: EARLY STAGES OF RECOVERY BOOK
Freq: Once | Status: AC
  Administered 2016-06-25: 21:00:00
  Filled 2016-06-25: qty 1

## 2016-06-25 MED ORDER — METOPROLOL SUCCINATE ER 50 MG PO TB24: 50.0000 mg | ORAL_TABLET | Freq: Every day | ORAL | Status: DC

## 2016-06-25 MED ORDER — ASPIRIN 325 MG PO TABS
325.0000 mg | ORAL_TABLET | Freq: Every day | ORAL | Status: DC
  Administered 2016-06-26: 325 mg via ORAL
  Filled 2016-06-25: qty 1

## 2016-06-25 MED ORDER — SODIUM CHLORIDE 0.9% FLUSH
3.0000 mL | Freq: Two times a day (BID) | INTRAVENOUS | Status: DC
  Administered 2016-06-25 – 2016-06-30 (×8): 3 mL via INTRAVENOUS

## 2016-06-25 MED ORDER — ONDANSETRON HCL 4 MG/2ML IJ SOLN: 4.0000 mg | Freq: Four times a day (QID) | INTRAMUSCULAR | Status: DC | PRN

## 2016-06-25 MED ORDER — CITALOPRAM HYDROBROMIDE 20 MG PO TABS
20.0000 mg | ORAL_TABLET | Freq: Every morning | ORAL | Status: DC
  Administered 2016-06-26 – 2016-06-30 (×5): 20 mg via ORAL
  Filled 2016-06-25 (×5): qty 1

## 2016-06-25 MED ORDER — SODIUM CHLORIDE 0.9 % IV SOLN
100.0000 mg | Freq: Two times a day (BID) | INTRAVENOUS | Status: DC
  Administered 2016-06-25 – 2016-06-30 (×10): 100 mg via INTRAVENOUS
  Filled 2016-06-25 (×18): qty 10

## 2016-06-25 MED ORDER — SODIUM CHLORIDE 0.9 % IV SOLN
1500.0000 mg | Freq: Once | INTRAVENOUS | Status: AC
  Administered 2016-06-25: 1500 mg via INTRAVENOUS
  Filled 2016-06-25: qty 30

## 2016-06-25 MED ORDER — HEPARIN SODIUM (PORCINE) 5000 UNIT/ML IJ SOLN: 5000.0000 [IU] | Freq: Three times a day (TID) | INTRAMUSCULAR | Status: DC

## 2016-06-25 MED ORDER — ACETAMINOPHEN 325 MG PO TABS: 650.0000 mg | ORAL_TABLET | Freq: Four times a day (QID) | ORAL | Status: DC | PRN

## 2016-06-25 MED ORDER — SODIUM CHLORIDE 0.9 % IV SOLN
INTRAVENOUS | Status: DC
  Administered 2016-06-25 – 2016-06-30 (×3): via INTRAVENOUS

## 2016-06-25 MED ORDER — PANTOPRAZOLE SODIUM 40 MG PO TBEC
40.0000 mg | DELAYED_RELEASE_TABLET | Freq: Every day | ORAL | Status: DC
  Administered 2016-06-26: 40 mg via ORAL
  Filled 2016-06-25: qty 1

## 2016-06-25 MED ORDER — HYDROCODONE-ACETAMINOPHEN 5-325 MG PO TABS: 1.0000 | ORAL_TABLET | ORAL | Status: DC | PRN

## 2016-06-25 MED ORDER — LORAZEPAM 2 MG/ML IJ SOLN
2.0000 mg | INTRAMUSCULAR | Status: DC | PRN
  Administered 2016-06-25: 1 mg via INTRAVENOUS
  Filled 2016-06-25: qty 1

## 2016-06-25 MED ORDER — METOPROLOL SUCCINATE ER 25 MG PO TB24
25.0000 mg | ORAL_TABLET | Freq: Every day | ORAL | Status: DC
  Administered 2016-06-26: 25 mg via ORAL
  Filled 2016-06-25: qty 1

## 2016-06-25 MED ORDER — LORAZEPAM 2 MG/ML IJ SOLN
INTRAMUSCULAR | Status: AC
  Administered 2016-06-25: 2 mg
  Filled 2016-06-25: qty 1

## 2016-06-25 MED ORDER — ADULT MULTIVITAMIN W/MINERALS CH
1.0000 | ORAL_TABLET | Freq: Every day | ORAL | Status: DC
  Administered 2016-06-27 – 2016-06-30 (×3): 1 via ORAL
  Filled 2016-06-25 (×3): qty 1

## 2016-06-25 MED ORDER — ASPIRIN 300 MG RE SUPP
300.0000 mg | Freq: Every day | RECTAL | Status: DC
  Administered 2016-06-25: 300 mg via RECTAL
  Filled 2016-06-25: qty 1

## 2016-06-25 MED ORDER — METOPROLOL SUCCINATE ER 50 MG PO TB24
50.0000 mg | ORAL_TABLET | Freq: Every day | ORAL | Status: DC
  Administered 2016-06-26: 50 mg via ORAL
  Filled 2016-06-25: qty 1

## 2016-06-25 MED ORDER — ONDANSETRON HCL 4 MG PO TABS: 4.0000 mg | ORAL_TABLET | Freq: Four times a day (QID) | ORAL | Status: DC | PRN

## 2016-06-25 NOTE — ED Triage Notes (Signed)
Pt. From home where she lives with her husband. He stated that he witnessed a seizure lasting almost 5 minutes. She has a history of R sided weakness from previous stroke. Patient is confused and not completely following commands but also has a hy of alzheimers. L side is strong, minimal effort against gravity on the R side. Last seen normal was around 11 this AM

## 2016-06-25 NOTE — ED Notes (Signed)
Patient had a witnessed seizure lasting approx. 30 sec in CT. 2 of ativan given per neurologist order

## 2016-06-25 NOTE — Consult Note (Addendum)
Stroke Neurology Consultation Note  Consult Requested by: Dr. Denton Lank   Reason for Consult: code stroke  Consult Date: 06/25/16  The history was obtained from the EMS staff and husband.  During history and examination, items was obtained unless otherwise noted.  History of Present Illness:  Heidi Miranda is a 83 y.o. Caucasian female with PMH of HTN, HLD, DM, Afib on eliquis, OSA, dementia followed with Dr. Lucia Gaskins at [facility] presented for code stroke. I also called husband to get more information in additional to received from EMS.   Pt apparently had 4-5 seizure episode with falls for the last week, and the seizure seemed more frequent overtime. She was here in ED 2 days ago after fall, CT head no bleeding and she was discharged home. (seems that pt or family did not provide seizure event with ED physician at that time). This morning, she was eating lunch and husband found her to constant drop her forks on the right hand with intermittent shaking, eventually when she was helped to bathroom at 11:00am and she had full blown seizure with LOC and whole body shaking. It lasted 5 min. After the seizure, husband found pt to have right facial droop and right arm weakness. Husband worried about stroke and EMS was called.  On EMS arrival, glucose 286, BP 144/106, right arm drift but right facial droop was not seen, did not follow commands well. Pt has chronic right LE weakness as per husband. EKG showed afib. En route, she received 600cc bolus and repeat Glucose 210.   On ED arrival, she was awake alert, attending to both side, no neglect, but disorientation (could be at baseline as per husband). Still has right UE drift but with asterixis also. Went to CT head, no acute abnormalities. However, she had again tonic clonic seizure on the CT table and lasted about 2 min. 2mg  Ativan given and will load with fosphenytoin.   Pt follows with cardiology for afib and changed from Xarelto to Eliquis in 03/2016. Husband  said she did not take today dose yet but compliant with medication and took yesterday dose. She also follows with Dr. Lucia Gaskins at [facility] for dementia, was recommended aricept and nemanda but not on her medication list.   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 Anxiety and depression   . Atrial fibrillation (HCC)   . Back pain   . Chronic kidney disease   . Chronic neck pain   . CVA (cerebral infarction)    , right parietal region  . GERD (gastroesophageal reflux disease)   . Hemorrhoids   . Hyperlipidemia   . Hypertension   . Hypertension   . OA (osteoarthritis) of knee   . Sleep apnea    , (Split 02/10/06 AHI 24/hr, O2 mi n 89%; CPAP 7 cm water pressure)  . TIA (transient ischemic attack)     Past Surgical History:  Procedure Laterality Date  . KNEE SURGERY      Family History  Problem Relation Age of Onset  . Diabetes Brother   . Heart attack Brother   . Alcohol abuse Brother   . Hypertension Mother   . Breast cancer Mother   . Heart attack Father   . Alcohol abuse Father   . Hypertension Sister   . Stroke Maternal Grandmother   . Stroke Paternal Grandmother   . Hypertension Sister  Social History:  reports that she has never smoked. She has never used smokeless tobacco. She reports that she drinks alcohol. She reports that she does not use drugs.  Allergies:  Allergies  Allergen Reactions  . Penicillins Hives    No current facility-administered medications on file prior to encounter.    Current Outpatient Prescriptions on File Prior to Encounter  Medication Sig Dispense Refill  . acetaminophen (TYLENOL) 325 MG tablet Take 650 mg by mouth every 6 (six) hours as needed for mild pain.    Marland Kitchen apixaban (ELIQUIS) 5 MG TABS tablet Take 1 tablet (5 mg total) by mouth 2 (two) times daily. 60 tablet 6  . atorvastatin (LIPITOR) 20 MG tablet Take 1 tablet (20  mg total) by mouth every morning. 90 tablet 1  . CALCIUM PO Take 1 tablet by mouth daily.    . Cetirizine HCl (ZYRTEC PO) Take 10 mg by mouth daily as needed (allergies).     . ciprofloxacin (CIPRO) 500 MG tablet Take 1 tablet (500 mg total) by mouth every 12 (twelve) hours. 10 tablet 0  . citalopram (CELEXA) 20 MG tablet Take 20 mg by mouth every morning.     . furosemide (LASIX) 20 MG tablet Take 1 tablet (20 mg total) by mouth daily. 90 tablet 3  . Lansoprazole (PREVACID PO) Take 1 tablet by mouth at bedtime.    . metoprolol succinate (TOPROL-XL) 25 MG 24 hr tablet Take 3 tablets (75 mg total) by mouth daily. 270 tablet 3  . Multiple Vitamin (MULTIVITAMIN WITH MINERALS) TABS Take 1 tablet by mouth daily.      Review of Systems: A full ROS was attempted today and was not able to be performed fully due to pt dementia history.  Systems assessed include - Constitutional, Eyes, HENT, Respiratory, Cardiovascular, Gastrointestinal, Genitourinary, Integument/breast, Hematologic/lymphatic, Musculoskeletal, Neurological, Behavioral/Psych, Endocrine,  Allergic/Immunologic - with pertinent responses as per HPI.  Physical Examination: Weight:  [179 lb 14.3 oz (81.6 kg)] 179 lb 14.3 oz (81.6 kg) (03/28 1300)  General - awake alert, well developed and well nourished.    Ophthalmologic - fundi not visualized.    Cardiovascular - irregularly irregular heart rate and rhythm.  Neuro - awake, alert, attending to both sides, no neglect, follows simple commands, orientated to self and age, but not time, place or people. Blinking to visual threat bilaterally, PERRL, eyes moving bilaterally, no facial droop, tongue in midline in mouth. LUE 5/5, RUE 4/5 with drift but able to hold without drift briefly and then drop with asterixis. BLE 2+/5 on pain stimulation. Babinski positive bilaterally, DTR 1+, sensation, coordination not cooperative and gait not tested.  NIH Stroke Scale  Level Of Consciousness 0=Alert;  keenly responsive 1=Not alert, but arousable by minor stimulation 2=Not alert, requires repeated stimulation 3=Responds only with reflex movements 0  LOC Questions to Month and Age 40=Answers both questions correctly 1=Answers one question correctly 2=Answers neither question correctly 1  LOC Commands      -Open/Close eyes     -Open/close grip 0=Performs both tasks correctly 1=Performs one task correctly 2=Performs neighter task correctly 1  Best Gaze 0=Normal 1=Partial gaze palsy 2=Forced deviation, or total gaze paresis 0  Visual 0=No visual loss 1=Partial hemianopia 2=Complete hemianopia 3=Bilateral hemianopia (blind including cortical blindness) 0  Facial Palsy 0=Normal symmetrical movement 1=Minor paralysis (asymmetry) 2=Partial paralysis (lower face) 3=Complete paralysis (upper and lower face) 0  Motor  0=No drift, limb holds posture for full 10 seconds 1=Drift, limb holds posture, no drift  to bed 2=Some antigravity effort, cannot maintain posture, drifts to bed 3=No effort against gravity, limb falls 4=No movement Right Arm 1     Leg 2    Left Arm 0     Leg 2  Limb Ataxia 0=Absent 1=Present in one limb 2=Present in two limbs 0  Sensory 0=Normal 1=Mild to moderate sensory loss 2=Severe to total sensory loss 0  Best Language 0=No aphasia, normal 1=Mild to moderate aphasia 2=Mute, global aphasia 3=Mute, global aphasia 1  Dysarthria 0=Normal 1=Mild to moderate 2=Severe, unintelligible or mute/anarthric 1  Extinction/Neglect 0=No abnormality 1=Extinction to bilateral simultaneous stimulation 2=Profound neglect 0  Total   9     Data Reviewed: I have personally reviewed the radiological images below and agree with the radiology interpretations.  Ct Head Wo Contrast 06/23/2016 IMPRESSION: No evidence of acute intracranial abnormality. Old right frontal and right cerebellar infarcts. Atrophy with small vessel ischemic changes. No evidence of traumatic injury to  the cervical spine. Moderate degenerative changes.   Ct Head Code Stroke W/o Cm 06/25/2016 IMPRESSION: 1. Stable non contrast CT appearance of the brain, no acute intracranial hemorrhage or cortically based infarct identified. 2. ASPECTS is 10.    Assessment: 83 y.o. female with PMH of HTN, HLD, DM, Afib on eliquis, OSA, dementia [facility] presented for code stroke. Obtained further information from EMS and husband. Apparently pt had multiple seizure episodes with falls at home for the last one week, and today found to have right facial droop and right UE weakness after a GTC episode. She again had another GTC seizure on CT table. Ativan given and loaded with fosphenytoin. After seizure, her right facial and right UE weakness gradually improving, consistent with todd's paralysis. Will admit to step down with internal medicine service and recommend to use vimpat for seizure control, and avoid keppra due to side effects in pt with dementia. EEG will be ordered. MRI brain with and without contrast for seizure and stroke evaluation.   Plan: - admit to internal medicine service, step down preferred due to seizure risk  - HgbA1c, fasting lipid panel - EEG - MRI of the brain with and without contrast to evaluate seizure and stroke - ativan and fosphenytoin given, recommend maintenance with vimpat and avoid keppra due to dementia. - seizure precautions. - Telemetry monitoring - Frequent neuro checks - neurology will follow - on discharge, pt should [facility].   Thank you for this consultation and allowing us to participate in the care of this patient.  Marvel PlanJindong Refugio Mcconico, MD PhD Stroke Neurology 06/25/2016 2:28 PM  This patient is critically ill due to [facility], frequent seizures, dementia and fall risk and at significant risk of neurological worsening, death form status epilepticus, stroke, heart failure, delirium. This patient's care requires constant  monitoring of vital signs, hemodynamics, respiratory and cardiac monitoring, review of multiple databases, neurological assessment, discussion with family, other specialists and medical decision making of high complexity. I spent 45 minutes of neurocritical care time in the care of this patient.

## 2016-06-25 NOTE — Code Documentation (Signed)
83 y.o.female w/ PMHx of TIA, CVA OSA, HTN, HLD and a-fibb [facility] as code stroke. Pt was at home this morning in her normal state of health per her husband. At around 1100 this morning the pt went to the bathroom where she then had a witnessed seizure. EMS notified and appreciated right sided deficits w/ difficulty following commands. Per EMS, pt on Eliquous. [facility], labs were drawn and pt taken to CT. CT (-) ASPECTS 10. NIHSS 10. See EMR for code stroke times and NIHSS. On assessment, pt confused, intermittently following commands, drift in RUE, could not resist gravity in BLE, mild aphasia and mild dysarthria. While pt being assessed in CT, pt had seizure. Neurologist at bedside. 2mg  ativan given. Seizure symptoms resolved. Pt taken back to ED. Pt sleepy, in no apparent distress and VSS. ED bedside handoff with ED RN Kirt BoysMolly.

## 2016-06-25 NOTE — Progress Notes (Addendum)
MEDICATION RELATED CONSULT NOTE - INITIAL   Pharmacy Consult for Drug interaction monitoring with antiepileptic medications  Allergies  Allergen Reactions  . Penicillins Hives    Has patient had a PCN reaction causing immediate rash, facial/tongue/throat swelling, SOB or lightheadedness with hypotension: Yes Has patient had a PCN reaction causing severe rash involving mucus membranes or skin necrosis: No Has patient had a PCN reaction that required hospitalization: No Has patient had a PCN reaction occurring within the last 10 years: No If all of the above answers are "NO", then may proceed with Cephalosporin use.     Patient Measurements: Height: 5\' 6"  (167.6 cm) Weight: 179 lb 14.3 oz (81.6 kg) IBW/kg (Calculated) : 59.3 Adjusted Body Weight:   Vital Signs: BP: 114/61 (03/28 1925) Pulse Rate: 65 (03/28 1925) Intake/Output from previous day: No intake/output data recorded. Intake/Output from this shift: No intake/output data recorded.  Labs:  Recent Labs  06/25/16 1335 06/25/16 1341  WBC 8.0  --   HGB 13.3 12.6  HCT 39.6 37.0  PLT 140*  --   APTT 38*  --   CREATININE 1.20* 1.20*  ALBUMIN 3.5  --   PROT 6.0*  --   AST 24  --   ALT 18  --   ALKPHOS 90  --   BILITOT 1.0  --    Estimated Creatinine Clearance: 39.6 mL/min (A) (by C-G formula based on SCr of 1.2 mg/dL (H)).   Microbiology: Recent Results (from the past 720 hour(s))  Urine culture     Status: Abnormal   Collection Time: 06/23/16  9:08 AM  Result Value Ref Range Status   Specimen Description URINE, CLEAN CATCH  Final   Special Requests NONE  Final   Culture MULTIPLE SPECIES PRESENT, SUGGEST RECOLLECTION (A)  Final   Report Status 06/24/2016 FINAL  Final    Medical History: Past Medical History:  Diagnosis Date  . Anxiety and depression   . Atrial fibrillation (HCC)   . Back pain   . Chronic kidney disease   . Chronic neck pain   . CVA (cerebral infarction)    , right parietal region  .  GERD (gastroesophageal reflux disease)   . Hemorrhoids   . Hyperlipidemia   . Hypertension   . Hypertension   . OA (osteoarthritis) of knee   . Sleep apnea    , (Split 02/10/06 AHI 24/hr, O2 mi n 89%; CPAP 7 cm water pressure)  . TIA (transient ischemic attack)     Medications:  Scheduled:  .  stroke: mapping our early stages of recovery book   Does not apply Once  . apixaban  5 mg Oral BID  . aspirin  300 mg Rectal Daily   Or  . aspirin  325 mg Oral Daily  . [START ON 06/26/2016] atorvastatin  20 mg Oral q1800  . [START ON 06/26/2016] citalopram  20 mg Oral q morning - 10a  . lacosamide (VIMPAT) IV  100 mg Intravenous Q12H  . [START ON 06/26/2016] metoprolol succinate  25 mg Oral Daily   And  . metoprolol succinate  50 mg Oral Daily  . multivitamin with minerals  1 tablet Oral Daily  . [START ON 06/26/2016] pantoprazole  40 mg Oral Daily  . sodium chloride flush  3 mL Intravenous Q12H    Assessment: 83yo female presenting with new onset seizures and was started on lacosamide 100mg  IV q12 as well as given Fosphenytoin x 1 dose.  1.  Metoprolol- levels/effects of Lacosamide  may be increased by "bradycardia causing agents" 2.  No other interactions found at this time 3.  Lacosamide can cause bradycardia in < 1%  Plan:  Monitor heart rate Watch for changes in medication   Marisue HumbleKendra Basilio Meadow, PharmD Clinical Pharmacist [facility] [place]

## 2016-06-25 NOTE — ED Provider Notes (Addendum)
[facility]-EMERGENCY DEPT Provider Note   CSN: [id number] Arrival date & time: 06/25/16  1331     History   Chief Complaint Chief Complaint  Patient presents with  . Code Stroke    HPI Heidi Miranda is a 83 y.o. female.  Patient w hx a fib, presents via ems as possible code stroke.  Pt w hx right sided weakness from prior cva.  Spouse noted a 5 minute seizure today, no hx same. Pt confused afterwards. Pt also w hx dementia.  Ems reports last normal at 11 AM.  Pt unable to give additional hx - level 5 caveat.      The history is provided by the patient and the EMS personnel.    Past Medical History:  Diagnosis Date  . Anxiety and depression   . Atrial fibrillation (HCC)   . Back pain   . Chronic kidney disease   . Chronic neck pain   . CVA (cerebral infarction)    , right parietal region  . GERD (gastroesophageal reflux disease)   . Hemorrhoids   . Hyperlipidemia   . Hypertension   . Hypertension   . OA (osteoarthritis) of knee   . Sleep apnea    , (Split 02/10/06 AHI 24/hr, O2 mi n 89%; CPAP 7 cm water pressure)  . TIA (transient ischemic attack)     Patient Active Problem List   Diagnosis Date Noted  . Chronic anticoagulation 11/15/2014  . Cognitive decline 07/15/2014  . Atrial fibrillation (HCC) 03/28/2012  . Acute CHF (HCC) 03/28/2012  . SOB (shortness of breath) 03/28/2012  . HTN (hypertension) 03/28/2012  . Forgetfulness 03/28/2012    Past Surgical History:  Procedure Laterality Date  . KNEE SURGERY      OB History    No data available       Home Medications    Prior to Admission medications   Medication Sig Start Date End Date Taking? Authorizing Provider  acetaminophen (TYLENOL) 325 MG tablet Take 650 mg by mouth every 6 (six) hours as needed for mild pain.    Historical Provider, MD  apixaban (ELIQUIS) 5 MG TABS tablet Take 1 tablet (5 mg total) by mouth 2 (two) times daily. 04/04/16   Lewayne Bunting, MD  atorvastatin (LIPITOR) 20 MG tablet  Take 1 tablet (20 mg total) by mouth every morning. 04/08/16   Lewayne Bunting, MD  CALCIUM PO Take 1 tablet by mouth daily.    Historical Provider, MD  Cetirizine HCl (ZYRTEC PO) Take 10 mg by mouth daily as needed (allergies).     Historical Provider, MD  ciprofloxacin (CIPRO) 500 MG tablet Take 1 tablet (500 mg total) by mouth every 12 (twelve) hours. 06/23/16   Raeford Razor, MD  citalopram (CELEXA) 20 MG tablet Take 20 mg by mouth every morning.     Historical Provider, MD  furosemide (LASIX) 20 MG tablet Take 1 tablet (20 mg total) by mouth daily. 03/20/16   Lewayne Bunting, MD  Lansoprazole (PREVACID PO) Take 1 tablet by mouth at bedtime.    Historical Provider, MD  metoprolol succinate (TOPROL-XL) 25 MG 24 hr tablet Take 3 tablets (75 mg total) by mouth daily. 03/20/16   Lewayne Bunting, MD  Multiple Vitamin (MULTIVITAMIN WITH MINERALS) TABS Take 1 tablet by mouth daily.    Historical Provider, MD    Family History Family History  Problem Relation Age of Onset  . Diabetes Brother   . Heart attack Brother   . Alcohol  abuse Brother   . Hypertension Mother   . Breast cancer Mother   . Heart attack Father   . Alcohol abuse Father   . Hypertension Sister   . Stroke Maternal Grandmother   . Stroke Paternal Grandmother   . Hypertension Sister     Social History Social History  Substance Use Topics  . Smoking status: Never Smoker  . Smokeless tobacco: Never Used  . Alcohol use 0.0 oz/week     Comment: daily     Allergies   Penicillins   Review of Systems Review of Systems  Constitutional: Negative for fever.  level 5 caveat - pt unresponsive   Physical Exam Updated Vital Signs BP 125/71   Pulse 83   Resp 19   Ht 5\' 6"  (1.676 m)   Wt 81.6 kg   SpO2 97%   BMI 29.04 kg/m   Physical Exam  Constitutional: She appears well-developed and well-nourished. No distress.  HENT:  Mouth/Throat: Oropharynx is clear and moist.  Eyes: Conjunctivae are normal. No scleral  icterus.  Neck: Neck supple. No tracheal deviation present.  Cardiovascular: Normal rate, normal heart sounds and intact distal pulses.  Exam reveals no gallop and no friction rub.   No murmur heard. Pulmonary/Chest: Effort normal and breath sounds normal. No respiratory distress. She exhibits no tenderness.  Abdominal: Soft. Normal appearance and bowel sounds are normal. She exhibits no distension. There is no tenderness.  Genitourinary:  Genitourinary Comments: No cva tenderness  Musculoskeletal: She exhibits no edema.  CTLS spine, non tender, aligned, no step off. Good rom bil ext, no focal bony tenderness, distal pulses palp.   Neurological: She is alert.  Awake. Moving bil ext spontaneously w good strength. Does not follow commands.    Skin: Skin is warm and dry. No rash noted. She is not diaphoretic.  Psychiatric:  Altered, slow to respond.   Nursing note and vitals reviewed.    ED Treatments / Results  Labs (all labs ordered are listed, but only abnormal results are displayed) Results for orders placed or performed during the hospital encounter of 06/25/16  Ethanol  Result Value Ref Range   Alcohol, Ethyl (B) <5 <5 mg/dL  Protime-INR  Result Value Ref Range   Prothrombin Time 17.7 (H) 11.4 - 15.2 seconds   INR 1.44   APTT  Result Value Ref Range   aPTT 38 (H) 24 - 36 seconds  CBC  Result Value Ref Range   WBC 8.0 4.0 - 10.5 K/uL   RBC 4.31 3.87 - 5.11 MIL/uL   Hemoglobin 13.3 12.0 - 15.0 g/dL   HCT 16.1 09.6 - 04.5 %   MCV 91.9 78.0 - 100.0 fL   MCH 30.9 26.0 - 34.0 pg   MCHC 33.6 30.0 - 36.0 g/dL   RDW 40.9 81.1 - 91.4 %   Platelets 140 (L) 150 - 400 K/uL  Differential  Result Value Ref Range   Neutrophils Relative % 75 %   Neutro Abs 6.0 1.7 - 7.7 K/uL   Lymphocytes Relative 16 %   Lymphs Abs 1.3 0.7 - 4.0 K/uL   Monocytes Relative 7 %   Monocytes Absolute 0.6 0.1 - 1.0 K/uL   Eosinophils Relative 2 %   Eosinophils Absolute 0.2 0.0 - 0.7 K/uL    Basophils Relative 0 %   Basophils Absolute 0.0 0.0 - 0.1 K/uL  Comprehensive metabolic panel  Result Value Ref Range   Sodium 141 135 - 145 mmol/L   Potassium 3.9 3.5 -  5.1 mmol/L   Chloride 109 101 - 111 mmol/L   CO2 23 22 - 32 mmol/L   Glucose, Bld 122 (H) 65 - 99 mg/dL   BUN 10 6 - 20 mg/dL   Creatinine, Ser 4.011.20 (H) 0.44 - 1.00 mg/dL   Calcium 8.8 (L) 8.9 - 10.3 mg/dL   Total Protein 6.0 (L) 6.5 - 8.1 g/dL   Albumin 3.5 3.5 - 5.0 g/dL   AST 24 15 - 41 U/L   ALT 18 14 - 54 U/L   Alkaline Phosphatase 90 38 - 126 U/L   Total Bilirubin 1.0 0.3 - 1.2 mg/dL   GFR calc non Af Amer 41 (L) >60 mL/min   GFR calc Af Amer 48 (L) >60 mL/min   Anion gap 9 5 - 15  I-Stat Chem 8, ED  ([facility])  Result Value Ref Range   Sodium 144 135 - 145 mmol/L   Potassium 3.9 3.5 - 5.1 mmol/L   Chloride 106 101 - 111 mmol/L   BUN 12 6 - 20 mg/dL   Creatinine, Ser 0.271.20 (H) 0.44 - 1.00 mg/dL   Glucose, Bld 253121 (H) 65 - 99 mg/dL   Calcium, Ion 6.641.10 (L) 1.15 - 1.40 mmol/L   TCO2 24 0 - 100 mmol/L   Hemoglobin 12.6 12.0 - 15.0 g/dL   HCT 40.337.0 47.436.0 - 25.946.0 %  I-stat troponin, ED ([facility] at [facility], [facility])  Result Value Ref Range   Troponin i, poc 0.02 0.00 - 0.08 ng/mL   Comment 3           Ct Head Wo Contrast  Result Date: 06/23/2016 CLINICAL DATA:  Fall, headache, neck pain, dementia EXAM: CT HEAD WITHOUT CONTRAST CT CERVICAL SPINE WITHOUT CONTRAST TECHNIQUE: Multidetector CT imaging of the head and cervical spine was performed following the standard protocol without intravenous contrast. Multiplanar CT image reconstructions of the cervical spine were also generated. COMPARISON:  MRI brain dated 07/31/2014 FINDINGS: CT HEAD FINDINGS Brain: No evidence of acute infarction, hemorrhage, hydrocephalus, extra-axial collection or mass lesion/mass effect. Subcortical white matter and periventricular small vessel ischemic changes. Encephalomalacic changes in the subcortical right frontal lobe (series 4/ image  21). Encephalomalacic changes related to old inferior right cerebellar infarct (series 4/ image 5). Global cortical atrophy.  Secondary ventricular prominence. Vascular: Intracranial atherosclerosis. Skull: Normal. Negative for fracture or focal lesion. Sinuses/Orbits: The visualized paranasal sinuses are essentially clear. The mastoid air cells are unopacified. Other: None. CT CERVICAL SPINE FINDINGS Alignment: Normal cervical lordosis. Skull base and vertebrae: No acute fracture. No primary bone lesion or focal pathologic process. Soft tissues and spinal canal: No prevertebral fluid or swelling. No visible canal hematoma. Disc levels: Moderate degenerative changes of the lower cervical spine. Spinal canal is patent. Upper chest: Visualized lung apices are clear. Other: Visualized thyroid is unremarkable. IMPRESSION: No evidence of acute intracranial abnormality. Old right frontal and right cerebellar infarcts. Atrophy with small vessel ischemic changes. No evidence of traumatic injury to the cervical spine. Moderate degenerative changes. Electronically Signed   By: Charline BillsSriyesh  Krishnan M.D.   On: 06/23/2016 10:07   Ct Cervical Spine Wo Contrast  Result Date: 06/23/2016 CLINICAL DATA:  Fall, headache, neck pain, dementia EXAM: CT HEAD WITHOUT CONTRAST CT CERVICAL SPINE WITHOUT CONTRAST TECHNIQUE: Multidetector CT imaging of the head and cervical spine was performed following the standard protocol without intravenous contrast. Multiplanar CT image reconstructions of the cervical spine were also generated. COMPARISON:  MRI brain dated 07/31/2014 FINDINGS: CT HEAD  FINDINGS Brain: No evidence of acute infarction, hemorrhage, hydrocephalus, extra-axial collection or mass lesion/mass effect. Subcortical white matter and periventricular small vessel ischemic changes. Encephalomalacic changes in the subcortical right frontal lobe (series 4/ image 21). Encephalomalacic changes related to old inferior right cerebellar  infarct (series 4/ image 5). Global cortical atrophy.  Secondary ventricular prominence. Vascular: Intracranial atherosclerosis. Skull: Normal. Negative for fracture or focal lesion. Sinuses/Orbits: The visualized paranasal sinuses are essentially clear. The mastoid air cells are unopacified. Other: None. CT CERVICAL SPINE FINDINGS Alignment: Normal cervical lordosis. Skull base and vertebrae: No acute fracture. No primary bone lesion or focal pathologic process. Soft tissues and spinal canal: No prevertebral fluid or swelling. No visible canal hematoma. Disc levels: Moderate degenerative changes of the lower cervical spine. Spinal canal is patent. Upper chest: Visualized lung apices are clear. Other: Visualized thyroid is unremarkable. IMPRESSION: No evidence of acute intracranial abnormality. Old right frontal and right cerebellar infarcts. Atrophy with small vessel ischemic changes. No evidence of traumatic injury to the cervical spine. Moderate degenerative changes. Electronically Signed   By: Charline Bills M.D.   On: 06/23/2016 10:07   Dg Foot Complete Right  Result Date: 06/23/2016 CLINICAL DATA:  Fall.  Right foot pain. EXAM: RIGHT FOOT COMPLETE - 3+ VIEW COMPARISON:  None. FINDINGS: Right second toe amputation. No acute osseous fracture. No dislocation. Healed deformity in the medial distal right first metatarsal is probably postsurgical. Mild first metatarsal-phalangeal joint osteoarthritis. Small plantar right calcaneal spur. No radiopaque foreign body. IMPRESSION: Right second toe amputation. No acute fracture or malalignment in the right foot. Electronically Signed   By: Delbert Phenix M.D.   On: 06/23/2016 13:11   Dg Hip Unilat With Pelvis Min 4 Views Right  Result Date: 06/23/2016 CLINICAL DATA:  Status post fall, confusion, possible right hip pain. Patient un able to communicate. EXAM: DG HIP (WITH OR WITHOUT PELVIS) 4+V RIGHT COMPARISON:  None available in PACs. FINDINGS: The bones are  subjectively osteopenic. No acute pelvic fracture is observed. There is moderate narrowing of the right hip joint space. No acute fracture or dislocation of the right hip is observed. The femoral neck and intertrochanteric and sub trochanteric regions appear normal. IMPRESSION: There is no acute or significant chronic bony abnormality of the right hip. Electronically Signed   By: David  [place] M.D.   On: 06/23/2016 11:20   Ct Head Code Stroke W/o Cm  Result Date: 06/25/2016 CLINICAL DATA:  Code stroke. 83 year old female with weakness, confusion, slurred speech. Initial encounter. EXAM: CT HEAD WITHOUT CONTRAST TECHNIQUE: Contiguous axial images were obtained from the base of the skull through the vertex without intravenous contrast. COMPARISON:  06/23/2016 and earlier. FINDINGS: Brain: No acute intracranial hemorrhage identified. No midline shift, mass effect, or evidence of intracranial mass lesion. Chronic appearing encephalomalacia in the inferior right cerebellum and anterior right frontal operculum appear stable since the brain MRI on 07/31/2014. Mild for age superimposed bilateral white matter hypodensity appears stable. Stable cerebral volume. Stable ventricle size and configuration. No cortically based acute infarct identified. Vascular: Calcified atherosclerosis at the skull base. No suspicious intracranial vascular hyperdensity. Skull: Stable.  No acute osseous abnormality identified. Sinuses/Orbits: Visualized paranasal sinuses and mastoids are stable and well pneumatized. Other: Negative orbit and scalp soft tissues. ASPECTS [facility] Stroke Program Early CT Score) - Ganglionic level infarction (caudate, lentiform nuclei, internal capsule, insula, M1-M3 cortex): 7 - Supraganglionic infarction (M4-M6 cortex): 3 Total score (0-10 with 10 being normal): 10 IMPRESSION: 1. Stable non contrast CT  appearance of the brain, no acute intracranial hemorrhage or cortically based infarct identified. 2. ASPECTS is  10. 3. The above was relayed via text pager to Dr. Jerel Shepherd on 06/25/2016 at 13:50 . Electronically Signed   By: Odessa Fleming M.D.   On: 06/25/2016 13:51    EKG  EKG Interpretation  Date/Time:  Wednesday June 25 2016 13:53:13 EDT Ventricular Rate:  98 PR Interval:    QRS Duration: 114 QT Interval:  372 QTC Calculation: 475 R Axis:   64 Text Interpretation:  Atrial fibrillation Incomplete left bundle branch block Nonspecific T wave abnormality Confirmed by Denton Lank  MD, Caryn Bee ([id number]) on 06/25/2016 2:38:56 PM       Radiology Ct Head Code Stroke W/o Cm  Result Date: 06/25/2016 CLINICAL DATA:  Code stroke. 83 year old female with weakness, confusion, slurred speech. Initial encounter. EXAM: CT HEAD WITHOUT CONTRAST TECHNIQUE: Contiguous axial images were obtained from the base of the skull through the vertex without intravenous contrast. COMPARISON:  06/23/2016 and earlier. FINDINGS: Brain: No acute intracranial hemorrhage identified. No midline shift, mass effect, or evidence of intracranial mass lesion. Chronic appearing encephalomalacia in the inferior right cerebellum and anterior right frontal operculum appear stable since the brain MRI on 07/31/2014. Mild for age superimposed bilateral white matter hypodensity appears stable. Stable cerebral volume. Stable ventricle size and configuration. No cortically based acute infarct identified. Vascular: Calcified atherosclerosis at the skull base. No suspicious intracranial vascular hyperdensity. Skull: Stable.  No acute osseous abnormality identified. Sinuses/Orbits: Visualized paranasal sinuses and mastoids are stable and well pneumatized. Other: Negative orbit and scalp soft tissues. ASPECTS ([place] Stroke Program Early CT Score) - Ganglionic level infarction (caudate, lentiform nuclei, internal capsule, insula, M1-M3 cortex): 7 - Supraganglionic infarction (M4-M6 cortex): 3 Total score (0-10 with 10 being normal): 10 IMPRESSION: 1. Stable non contrast CT  appearance of the brain, no acute intracranial hemorrhage or cortically based infarct identified. 2. ASPECTS is 10. 3. The above was relayed via text pager to Dr. Jerel Shepherd on 06/25/2016 at 13:50 . Electronically Signed   By: Odessa Fleming M.D.   On: 06/25/2016 13:51    Procedures Procedures (including critical care time)  Medications Ordered in ED Medications  LORazepam (ATIVAN) injection 2 mg (not administered)  lacosamide (VIMPAT) 100 mg in sodium chloride 0.9 % 25 mL IVPB (not administered)  LORazepam (ATIVAN) 2 MG/ML injection (2 mg  Given 06/25/16 1347)  fosPHENYtoin (CEREBYX) 1,500 mg PE in sodium chloride 0.9 % 50 mL IVPB (0 mg PE Intravenous Stopped 06/25/16 1432)     Initial Impression / Assessment and Plan / ED Course  I have reviewed the triage vital signs and the nursing notes.  Pertinent labs & imaging results that were available during my care of the patient were reviewed by me and considered in my medical decision making (see chart for details).  Iv ns. Continuous pulse ox and monitor. o2 [place].  Stat labs and stat ct.  Patient was a code stroke prior to arrival - neurology consulted and evaluated in ED.  CT neg acute.   Pt with another seizure in ED.  Generalized tonic clonic seizure activity.  sz persists. Iv ativan. Loaded w iv seizure rx therapy.  Neurology reassessed - discussed pt - they request admit to medical service re new onset, recurrent seizures.  Recheck pt, no current sz activity. Postictal.  CRITICAL CARE  RE: new onset seizures,  Recurrent seizures/status requiring iv medication, iv loading of seizure medication for sz control, altered mental  status.  Performed by: Suzi Roots Total critical care time: 40 minutes Critical care time was exclusive of separately billable procedures and treating other patients. Critical care was necessary to treat or prevent imminent or life-threatening deterioration. Critical care was time spent personally by me on the  following activities: development of treatment plan with patient and/or surrogate as well as nursing, discussions with consultants, evaluation of patient's response to treatment, examination of patient, obtaining history from patient or surrogate, ordering and performing treatments and interventions, ordering and review of laboratory studies, ordering and review of radiographic studies, pulse oximetry and re-evaluation of patient's condition.  Discussed pt with [facility] - they will admit, stepdown.    Final Clinical Impressions(s) / ED Diagnoses   Final diagnoses:  Seizure [facility])    New Prescriptions New Prescriptions   No medications on file         Cathren Laine, MD 06/25/16 1540

## 2016-06-25 NOTE — ED Notes (Signed)
MRI notified RN that pt could not be still after dose of ativan. Pt brought back to room.

## 2016-06-25 NOTE — H&P (Signed)
History and Physical    Heidi Miranda GNF:[id number] DOB: [birth year] DOA: 06/25/2016  PCP: Sissy Hoff, MD  Patient coming from: Home  Chief Complaint: Seizures  HPI: Heidi Miranda is a 83 y.o. female with medical history significant of HTN, A. fib on Eliquis and history of stroke (finding on the CT scan) she was brought to the hospital because of tonic-clonic seizures. Of note patient came into the hospital 2 days ago because of falls and upon further questioning husband mentioned she was having shakes. Initially patient came in as a code stroke, apparently earlier today she was eating her lunch with her husband and she dropped her for consult start shaking, and after that she had full blown seizures. Last about 5 minutes. She had another episode in the ED. Per EMS she had right facial droop was not seen in the ED here.  ED Course:  Vitals: WNL Labs: WNL Imaging: CT (done on the 26th) without acute findings. Old right frontal and right cerebellar infarcts. MRI showed acute CVA. Interventions: Received Ativan and Vimpat, 600 mL of IV fluids  Review of Systems:  Review of systems unobtainable secondary to somnolence and disorientation (probable dementia)  Past Medical History:  Diagnosis Date  . Anxiety and depression   . Atrial fibrillation (HCC)   . Back pain   . Chronic kidney disease   . Chronic neck pain   . CVA (cerebral infarction)    , right parietal region  . GERD (gastroesophageal reflux disease)   . Hemorrhoids   . Hyperlipidemia   . Hypertension   . Hypertension   . OA (osteoarthritis) of knee   . Sleep apnea    , (Split 02/10/06 AHI 24/hr, O2 mi n 89%; CPAP 7 cm water pressure)  . TIA (transient ischemic attack)     Past Surgical History:  Procedure Laterality Date  . KNEE SURGERY       reports that she has never smoked. She has never used smokeless tobacco. She reports that she drinks alcohol. She reports that she does not use drugs.  Allergies    Allergen Reactions  . Penicillins Hives    Has patient had a PCN reaction causing immediate rash, facial/tongue/throat swelling, SOB or lightheadedness with hypotension: Yes Has patient had a PCN reaction causing severe rash involving mucus membranes or skin necrosis: No Has patient had a PCN reaction that required hospitalization: No Has patient had a PCN reaction occurring within the last 10 years: No If all of the above answers are "NO", then may proceed with Cephalosporin use.     Family History  Problem Relation Age of Onset  . Diabetes Brother   . Heart attack Brother   . Alcohol abuse Brother   . Hypertension Mother   . Breast cancer Mother   . Heart attack Father   . Alcohol abuse Father   . Hypertension Sister   . Stroke Maternal Grandmother   . Stroke Paternal Grandmother   . Hypertension Sister     Prior to Admission medications   Medication Sig Start Date End Date Taking? Authorizing Provider  acetaminophen (TYLENOL) 325 MG tablet Take 650 mg by mouth every 6 (six) hours as needed for mild pain or headache.    Yes Historical Provider, MD  apixaban (ELIQUIS) 5 MG TABS tablet Take 1 tablet (5 mg total) by mouth 2 (two) times daily. 04/04/16  Yes Lewayne Bunting, MD  atorvastatin (LIPITOR) 20 MG tablet Take 1 tablet (20 mg total) by  mouth every morning. 04/08/16  Yes Lewayne Bunting, MD  CALCIUM PO Take 1 tablet by mouth daily.   Yes Historical Provider, MD  ciprofloxacin (CIPRO) 500 MG tablet Take 1 tablet (500 mg total) by mouth every 12 (twelve) hours. 06/23/16  Yes Raeford Razor, MD  citalopram (CELEXA) 20 MG tablet Take 20 mg by mouth every morning.    Yes Historical Provider, MD  furosemide (LASIX) 20 MG tablet Take 1 tablet (20 mg total) by mouth daily. 03/20/16  Yes Lewayne Bunting, MD  lansoprazole (PREVACID 24HR) 15 MG capsule Take 15 mg by mouth daily as needed (for reflux/GERD).   Yes Historical Provider, MD  metoprolol succinate (TOPROL-XL) 25 MG 24 hr tablet  Take 3 tablets (75 mg total) by mouth daily. Patient taking differently: Take 25-50 mg by mouth See admin instructions. 25 mg in the morning and 50 mg in the evening 03/20/16  Yes Lewayne Bunting, MD  Multiple Vitamin (MULTIVITAMIN WITH MINERALS) TABS Take 1 tablet by mouth daily.   Yes Historical Provider, MD    Physical Exam:  Vitals:   06/25/16 1300 06/25/16 1358 06/25/16 1415  BP:  139/75 125/71  Pulse:  89 83  Resp:  14 19  SpO2:  97%   Weight: 81.6 kg (179 lb 14.3 oz)    Height: 5\' 6"  (1.676 m)      Constitutional: NAD, Somnolent, not sure if this is postictal or secondary to Ativan given for seizures. Eyes: Not tested. ENMT: Mucous membranes are moist. Posterior pharynx clear of any exudate or lesions.Normal dentition.  Neck: normal, supple, no masses, no thyromegaly Respiratory: clear to auscultation bilaterally, no wheezing, no crackles. Normal respiratory effort. No accessory muscle use.  Cardiovascular: Regular rate and rhythm, no murmurs / rubs / gallops. No extremity edema. 2+ pedal pulses. No carotid bruits.  Abdomen: no tenderness, no masses palpated. No hepatosplenomegaly. Bowel sounds positive.  Musculoskeletal: no clubbing / cyanosis. No joint deformity upper and lower extremities. Good ROM, no contractures. Normal muscle tone.  Skin: no rashes, lesions, ulcers. No induration Neurologic: Not able to test because patient couldn't cooperate. Psychiatric: Not able to test because of some almonds.  Labs on Admission: I have personally reviewed following labs and imaging studies  CBC:  Recent Labs Lab 06/25/16 1335 06/25/16 1341  WBC 8.0  --   NEUTROABS 6.0  --   HGB 13.3 12.6  HCT 39.6 37.0  MCV 91.9  --   PLT 140*  --    Basic Metabolic Panel:  Recent Labs Lab 06/25/16 1335 06/25/16 1341  NA 141 144  K 3.9 3.9  CL 109 106  CO2 23  --   GLUCOSE 122* 121*  BUN 10 12  CREATININE 1.20* 1.20*  CALCIUM 8.8*  --    GFR: Estimated Creatinine  Clearance: 39.6 mL/min (A) (by C-G formula based on SCr of 1.2 mg/dL (H)). Liver Function Tests:  Recent Labs Lab 06/25/16 1335  AST 24  ALT 18  ALKPHOS 90  BILITOT 1.0  PROT 6.0*  ALBUMIN 3.5   No results for input(s): LIPASE, AMYLASE in the last 168 hours. No results for input(s): AMMONIA in the last 168 hours. Coagulation Profile:  Recent Labs Lab 06/25/16 1335  INR 1.44   Cardiac Enzymes: No results for input(s): CKTOTAL, CKMB, CKMBINDEX, TROPONINI in the last 168 hours. BNP (last 3 results) No results for input(s): PROBNP in the last 8760 hours. HbA1C: No results for input(s): HGBA1C in the last 72 hours.  CBG: No results for input(s): GLUCAP in the last 168 hours. Lipid Profile: No results for input(s): CHOL, HDL, LDLCALC, TRIG, CHOLHDL, LDLDIRECT in the last 72 hours. Thyroid Function Tests: No results for input(s): TSH, T4TOTAL, FREET4, T3FREE, THYROIDAB in the last 72 hours. Anemia Panel: No results for input(s): VITAMINB12, FOLATE, FERRITIN, TIBC, IRON, RETICCTPCT in the last 72 hours. Urine analysis:    Component Value Date/Time   COLORURINE AMBER (A) 06/23/2016 0908   APPEARANCEUR HAZY (A) 06/23/2016 0908   LABSPEC 1.023 06/23/2016 0908   PHURINE 5.0 06/23/2016 0908   GLUCOSEU NEGATIVE 06/23/2016 0908   HGBUR SMALL (A) 06/23/2016 0908   BILIRUBINUR NEGATIVE 06/23/2016 0908   KETONESUR NEGATIVE 06/23/2016 0908   PROTEINUR NEGATIVE 06/23/2016 0908   UROBILINOGEN 1.0 03/28/2012 1119   NITRITE POSITIVE (A) 06/23/2016 0908   LEUKOCYTESUR NEGATIVE 06/23/2016 0908   Sepsis Labs: !!!!!!!!!!!!!!!!!!!!!!!!!!!!!!!!!!!!!!!!!!!! Invalid input(s): PROCALCITONIN, LACTICIDVEN Recent Results (from the past 240 hour(s))  Urine culture     Status: Abnormal   Collection Time: 06/23/16  9:08 AM  Result Value Ref Range Status   Specimen Description URINE, CLEAN CATCH  Final   Special Requests NONE  Final   Culture MULTIPLE SPECIES PRESENT, SUGGEST RECOLLECTION (A)   Final   Report Status 06/24/2016 FINAL  Final     Radiological Exams on Admission: Ct Head Code Stroke W/o Cm  Result Date: 06/25/2016 CLINICAL DATA:  Code stroke. 83 year old female with weakness, confusion, slurred speech. Initial encounter. EXAM: CT HEAD WITHOUT CONTRAST TECHNIQUE: Contiguous axial images were obtained from the base of the skull through the vertex without intravenous contrast. COMPARISON:  06/23/2016 and earlier. FINDINGS: Brain: No acute intracranial hemorrhage identified. No midline shift, mass effect, or evidence of intracranial mass lesion. Chronic appearing encephalomalacia in the inferior right cerebellum and anterior right frontal operculum appear stable since the brain MRI on 07/31/2014. Mild for age superimposed bilateral white matter hypodensity appears stable. Stable cerebral volume. Stable ventricle size and configuration. No cortically based acute infarct identified. Vascular: Calcified atherosclerosis at the skull base. No suspicious intracranial vascular hyperdensity. Skull: Stable.  No acute osseous abnormality identified. Sinuses/Orbits: Visualized paranasal sinuses and mastoids are stable and well pneumatized. Other: Negative orbit and scalp soft tissues. ASPECTS (Alberta Stroke Program Early CT Score) - Ganglionic level infarction (caudate, lentiform nuclei, internal capsule, insula, M1-M3 cortex): 7 - Supraganglionic infarction (M4-M6 cortex): 3 Total score (0-10 with 10 being normal): 10 IMPRESSION: 1. Stable non contrast CT appearance of the brain, no acute intracranial hemorrhage or cortically based infarct identified. 2. ASPECTS is 10. 3. The above was relayed via text pager to Dr. Jerel Shepherd on 06/25/2016 at 13:50 . Electronically Signed   By: Odessa Fleming M.D.   On: 06/25/2016 13:51    EKG: Independently reviewed.   Assessment/Plan Principal Problem:   Seizure (HCC) Active Problems:   Atrial fibrillation (HCC)   HTN (hypertension)   Cognitive decline   H/O: CVA  (cerebrovascular accident)   Dementia without behavioral disturbance   Weakness generalized    Probable acute CVA -Patient presented with new onset seizure and right facial droop (was not seen in the ED). -MRI although degraded by motion showed areas of restricted diffusion in the left frontal cortex compatible with acute infarct. -She has atrial fibrillation, she is on Eliquis, per daughter not sure if she takes all her medications. -Start stroke pathway  Seizures -New onset seizure, generalized seizures, with postictal state. -Nephrology consulted, risk factors including dementia and history of stroke. -Recent  CT (done on 3/26) showed old infarct. -Given Ativan, fosphenytoin, started on Vimpat by neurology.  Atrial fibrillation -Rate is controlled, heart rate mainly in the 80s with one bout of 100 teens. -CHA2DS2-VASc is at least 6, female, 2+ age, 2+ stroke and HTN. -She is on Eliquis, continued.  Dementia without behavioral disturbances -Spoke with her daughter at bedside, she describes classic short-term memory loss consistent with dementia. -This is been worsening in the past 1 year.  Hypertension -Blood pressure control, continue home medications.  Generalized weakness -Recent UTI, treated with ciprofloxacin, urine culture showed no growth to date, stop antibiotics, recheck UA and cultures.  OSA -Patient tried CPAP before was not able to use it, will hold on using it for now.  DVT prophylaxis: SQ Heparin Code Status: DNR, verified with her daughter at bedside, (daughter is clinical Child psychotherapistsocial worker) Family Communication: Plan D/W patient Disposition Plan: Home Consults called:  Admission status: Obs, SDU   [facility] Health CenterELMAHI,Anntionette Madkins A MD [facility] Pager [phone]  If 7PM-7AM, please contact night-coverage [email or web address] Password [facility] [email or web address]  06/25/2016, 4:22 PM

## 2016-06-25 NOTE — Progress Notes (Signed)
Pt went to MRI and is unavailable for EEG. Will do tomorrow when schedule permits. Dr. Roda ShuttersXu aware

## 2016-06-26 ENCOUNTER — Observation Stay (HOSPITAL_COMMUNITY): Payer: Medicare Other

## 2016-06-26 ENCOUNTER — Observation Stay (HOSPITAL_BASED_OUTPATIENT_CLINIC_OR_DEPARTMENT_OTHER): Payer: Medicare Other

## 2016-06-26 DIAGNOSIS — G4089 Other seizures: Secondary | ICD-10-CM | POA: Diagnosis present

## 2016-06-26 DIAGNOSIS — Z66 Do not resuscitate: Secondary | ICD-10-CM | POA: Diagnosis present

## 2016-06-26 DIAGNOSIS — Z880 Allergy status to penicillin: Secondary | ICD-10-CM | POA: Diagnosis not present

## 2016-06-26 DIAGNOSIS — I6789 Other cerebrovascular disease: Secondary | ICD-10-CM

## 2016-06-26 DIAGNOSIS — I639 Cerebral infarction, unspecified: Secondary | ICD-10-CM

## 2016-06-26 DIAGNOSIS — F419 Anxiety disorder, unspecified: Secondary | ICD-10-CM | POA: Diagnosis present

## 2016-06-26 DIAGNOSIS — G8191 Hemiplegia, unspecified affecting right dominant side: Secondary | ICD-10-CM | POA: Diagnosis present

## 2016-06-26 DIAGNOSIS — G9340 Encephalopathy, unspecified: Secondary | ICD-10-CM | POA: Diagnosis present

## 2016-06-26 DIAGNOSIS — Z7901 Long term (current) use of anticoagulants: Secondary | ICD-10-CM | POA: Diagnosis not present

## 2016-06-26 DIAGNOSIS — F329 Major depressive disorder, single episode, unspecified: Secondary | ICD-10-CM | POA: Diagnosis present

## 2016-06-26 DIAGNOSIS — I482 Chronic atrial fibrillation: Secondary | ICD-10-CM | POA: Diagnosis present

## 2016-06-26 DIAGNOSIS — R569 Unspecified convulsions: Secondary | ICD-10-CM

## 2016-06-26 DIAGNOSIS — I63529 Cerebral infarction due to unspecified occlusion or stenosis of unspecified anterior cerebral artery: Secondary | ICD-10-CM | POA: Diagnosis present

## 2016-06-26 DIAGNOSIS — E1122 Type 2 diabetes mellitus with diabetic chronic kidney disease: Secondary | ICD-10-CM | POA: Diagnosis present

## 2016-06-26 DIAGNOSIS — Z8673 Personal history of transient ischemic attack (TIA), and cerebral infarction without residual deficits: Secondary | ICD-10-CM | POA: Diagnosis not present

## 2016-06-26 DIAGNOSIS — I129 Hypertensive chronic kidney disease with stage 1 through stage 4 chronic kidney disease, or unspecified chronic kidney disease: Secondary | ICD-10-CM | POA: Diagnosis present

## 2016-06-26 DIAGNOSIS — G4733 Obstructive sleep apnea (adult) (pediatric): Secondary | ICD-10-CM | POA: Diagnosis present

## 2016-06-26 DIAGNOSIS — Z79899 Other long term (current) drug therapy: Secondary | ICD-10-CM | POA: Diagnosis not present

## 2016-06-26 DIAGNOSIS — R1310 Dysphagia, unspecified: Secondary | ICD-10-CM | POA: Diagnosis present

## 2016-06-26 DIAGNOSIS — K219 Gastro-esophageal reflux disease without esophagitis: Secondary | ICD-10-CM | POA: Diagnosis present

## 2016-06-26 DIAGNOSIS — F0390 Unspecified dementia without behavioral disturbance: Secondary | ICD-10-CM | POA: Diagnosis present

## 2016-06-26 DIAGNOSIS — R4189 Other symptoms and signs involving cognitive functions and awareness: Secondary | ICD-10-CM | POA: Diagnosis not present

## 2016-06-26 DIAGNOSIS — E785 Hyperlipidemia, unspecified: Secondary | ICD-10-CM | POA: Diagnosis present

## 2016-06-26 DIAGNOSIS — N189 Chronic kidney disease, unspecified: Secondary | ICD-10-CM | POA: Diagnosis present

## 2016-06-26 DIAGNOSIS — E876 Hypokalemia: Secondary | ICD-10-CM | POA: Diagnosis present

## 2016-06-26 DIAGNOSIS — R531 Weakness: Secondary | ICD-10-CM | POA: Diagnosis not present

## 2016-06-26 DIAGNOSIS — R29810 Facial weakness: Secondary | ICD-10-CM | POA: Diagnosis present

## 2016-06-26 DIAGNOSIS — I10 Essential (primary) hypertension: Secondary | ICD-10-CM | POA: Diagnosis not present

## 2016-06-26 DIAGNOSIS — I6523 Occlusion and stenosis of bilateral carotid arteries: Secondary | ICD-10-CM | POA: Diagnosis present

## 2016-06-26 LAB — CBC
HCT: 38.2 % (ref 36.0–46.0)
Hemoglobin: 12.7 g/dL (ref 12.0–15.0)
MCH: 30.6 pg (ref 26.0–34.0)
MCHC: 33.2 g/dL (ref 30.0–36.0)
MCV: 92 fL (ref 78.0–100.0)
PLATELETS: 130 10*3/uL — AB (ref 150–400)
RBC: 4.15 MIL/uL (ref 3.87–5.11)
RDW: 13.3 % (ref 11.5–15.5)
WBC: 6.6 10*3/uL (ref 4.0–10.5)

## 2016-06-26 LAB — LIPID PANEL
CHOL/HDL RATIO: 2.9 ratio
CHOLESTEROL: 111 mg/dL (ref 0–200)
HDL: 38 mg/dL — ABNORMAL LOW (ref >40)
LDL Cholesterol: 63 mg/dL (ref 0–99)
Triglycerides: 50 mg/dL (ref <150)
VLDL: 10 mg/dL (ref 0–40)

## 2016-06-26 LAB — BASIC METABOLIC PANEL
Anion gap: 10 (ref 5–15)
BUN: 9 mg/dL (ref 6–20)
CALCIUM: 8.7 mg/dL — AB (ref 8.9–10.3)
CO2: 26 mmol/L (ref 22–32)
CREATININE: 0.85 mg/dL (ref 0.44–1.00)
Chloride: 109 mmol/L (ref 101–111)
GFR calc Af Amer: >60 mL/min (ref >60)
GFR calc non Af Amer: >60 mL/min (ref >60)
GLUCOSE: 104 mg/dL — AB (ref 65–99)
Potassium: 3.5 mmol/L (ref 3.5–5.1)
Sodium: 145 mmol/L (ref 135–145)

## 2016-06-26 LAB — ECHOCARDIOGRAM COMPLETE
HEIGHTINCHES: 66 in
Weight: 2878.33 oz

## 2016-06-26 LAB — GLUCOSE, CAPILLARY: GLUCOSE-CAPILLARY: 133 mg/dL — AB (ref 65–99)

## 2016-06-26 MED ORDER — INSULIN ASPART 100 UNIT/ML ~~LOC~~ SOLN: 0.0000 [IU] | Freq: Three times a day (TID) | SUBCUTANEOUS | Status: DC

## 2016-06-26 MED ORDER — CHLORHEXIDINE GLUCONATE 0.12 % MT SOLN
15.0000 mL | Freq: Two times a day (BID) | OROMUCOSAL | Status: DC
  Administered 2016-06-26 – 2016-06-30 (×8): 15 mL via OROMUCOSAL
  Filled 2016-06-26 (×8): qty 15

## 2016-06-26 MED ORDER — IOPAMIDOL (ISOVUE-370) INJECTION 76%
INTRAVENOUS | Status: AC
  Administered 2016-06-26: 50 mL
  Filled 2016-06-26: qty 50

## 2016-06-26 NOTE — Procedures (Signed)
History: 83 year old female with a history of seizures.  Sedation: None  Technique: This is a 21 channel routine scalp EEG performed at the bedside with bipolar and monopolar montages arranged in accordance to the international 10/20 system of electrode placement. One channel was dedicated to EKG recording.    Background: There is a posterior dominant rhythm of 9 Hz. In addition, there is generalized irregular theta and delta slowing even during periods of maximal wakefulness. The predominance of this EEG is performed during sleeping and drowsiness. Sleep structures appear symmetric.  Photic stimulation: Physiologic driving is now performed  EEG Abnormalities: 1) mild generalized irregular slow activity  Clinical Interpretation: This EEG is consistent with a mild generalized nonspecific cerebral dysfunction (encephalopathy).. There was no seizure or seizure predisposition recorded on this study. Please note that a normal EEG does not preclude the possibility of epilepsy.   Heidi SlotMcNeill Chee Dimon, MD [facility] [phone]  If 7pm- 7am, please page neurology on call as listed in AMION.

## 2016-06-26 NOTE — Progress Notes (Signed)
Pt transferred from ED to 4E10, bedside report given.  Pt is alert to self, callbell within reach, and bed alarm on.  Will continue to monitor.

## 2016-06-26 NOTE — Progress Notes (Signed)
PROGRESS NOTE  Heidi Miranda  ZOX:[id number] DOB: Mar 02, 1933 DOA: 06/25/2016 PCP: Sissy Hoff, MD Outpatient Specialists:  Subjective: Awake and alert this morning no problems.  Brief Narrative:  Heidi Miranda is a 83 y.o. female with medical history significant of HTN, A. fib on Eliquis and history of stroke (finding on the CT scan) she was brought to the hospital because of tonic-clonic seizures. Of note patient came into the hospital 2 days ago because of falls and upon further questioning husband mentioned she was having shakes. Initially patient came in as a code stroke, apparently earlier today she was eating her lunch with her husband and she dropped her for consult start shaking, and after that she had full blown seizures. Last about 5 minutes. She had another episode in the ED. Per EMS she had right facial droop was not seen in the ED here.  Assessment & Plan:   Principal Problem:   Seizure (HCC) Active Problems:   Atrial fibrillation (HCC)   HTN (hypertension)   Cognitive decline   H/O: CVA (cerebrovascular accident)   Dementia without behavioral disturbance   Weakness generalized   Acute CVA (cerebrovascular accident) (HCC)   Probable acute CVA -Patient presented with new onset seizure and right facial droop (was not seen in the ED). -MRI although degraded by motion showed areas of restricted diffusion in the left frontal cortex compatible with acute infarct. -She has atrial fibrillation, she is on Eliquis, per daughter not sure if she takes all her medications. -Continue stroke workup, weight recommendation from stroke team.  Seizures -New onset seizure, generalized seizures, with postictal state. -Nephrology consulted, risk factors including dementia and history of stroke. -Recent CT (done on 3/26) showed old infarct. -Given Ativan, fosphenytoin, started on Vimpat by neurology.  Atrial fibrillation -Rate is controlled, heart rate mainly in the 80s with one  bout of 100 teens. -CHA2DS2-VASc is at least 6, female, 2+ age, 2+ stroke and HTN. -She is on Eliquis, continued.  Dementia without behavioral disturbances -Spoke with her daughter at bedside, she describes classic short-term memory loss consistent with dementia. -This is been worsening in the past 1 year.  Hypertension -Blood pressure control, continue home medications.  Generalized weakness -Recent UTI, treated with ciprofloxacin, urine culture showed no growth to date, stop antibiotics, recheck UA and cultures.  OSA -Patient tried CPAP before was not able to use it, will hold on using it for now.   DVT prophylaxis:  Code Status: DNR Family Communication:  Disposition Plan:  Diet: Diet NPO time specified Except for: Ice Chips  Consultants:   Neuro  Procedures:   EEG pending  Antimicrobials:   None  Objective: Vitals:   06/26/16 0335 06/26/16 0500 06/26/16 0850 06/26/16 1147  BP:  (!) 184/96 134/76   Pulse: 89  85   Resp: 17     Temp: 97.7 F (36.5 C)  97.6 F (36.4 C) 97.7 F (36.5 C)  TempSrc: Oral  Axillary Oral  SpO2: 99%  100%   Weight:      Height:        Intake/Output Summary (Last 24 hours) at 06/26/16 1219 Last data filed at 06/26/16 0850  Gross per 24 hour  Intake               25 ml  Output                0 ml  Net  25 ml   Filed Weights   06/25/16 1300  Weight: 81.6 kg (179 lb 14.3 oz)    Examination: General exam: Appears calm and comfortable  Respiratory system: Clear to auscultation. Respiratory effort normal. Cardiovascular system: S1 & S2 heard, RRR. No JVD, murmurs, rubs, gallops or clicks. No pedal edema. Gastrointestinal system: Abdomen is nondistended, soft and nontender. No organomegaly or masses felt. Normal bowel sounds heard. Central nervous system: Alert and oriented. No focal neurological deficits. Extremities: Symmetric 5 x 5 power. Skin: No rashes, lesions or ulcers Psychiatry: Judgement and  insight appear normal. Mood & affect appropriate.   Data Reviewed: I have personally reviewed following labs and imaging studies  CBC:  Recent Labs Lab 06/25/16 1335 06/25/16 1341 06/26/16 0329  WBC 8.0  --  6.6  NEUTROABS 6.0  --   --   HGB 13.3 12.6 12.7  HCT 39.6 37.0 38.2  MCV 91.9  --  92.0  PLT 140*  --  130*   Basic Metabolic Panel:  Recent Labs Lab 06/25/16 1335 06/25/16 1341 06/26/16 0329  NA 141 144 145  K 3.9 3.9 3.5  CL 109 106 109  CO2 23  --  26  GLUCOSE 122* 121* 104*  BUN 10 12 9   CREATININE 1.20* 1.20* 0.85  CALCIUM 8.8*  --  8.7*   GFR: Estimated Creatinine Clearance: 55.9 mL/min (by C-G formula based on SCr of 0.85 mg/dL). Liver Function Tests:  Recent Labs Lab 06/25/16 1335  AST 24  ALT 18  ALKPHOS 90  BILITOT 1.0  PROT 6.0*  ALBUMIN 3.5   No results for input(s): LIPASE, AMYLASE in the last 168 hours. No results for input(s): AMMONIA in the last 168 hours. Coagulation Profile:  Recent Labs Lab 06/25/16 1335 06/25/16 2041  INR 1.44 1.34   Cardiac Enzymes: No results for input(s): CKTOTAL, CKMB, CKMBINDEX, TROPONINI in the last 168 hours. BNP (last 3 results) No results for input(s): PROBNP in the last 8760 hours. HbA1C: No results for input(s): HGBA1C in the last 72 hours. CBG: No results for input(s): GLUCAP in the last 168 hours. Lipid Profile:  Recent Labs  06/26/16 0329  CHOL 111  HDL 38*  LDLCALC 63  TRIG 50  CHOLHDL 2.9   Thyroid Function Tests:  Recent Labs  06/25/16 2041  TSH 1.717   Anemia Panel: No results for input(s): VITAMINB12, FOLATE, FERRITIN, TIBC, IRON, RETICCTPCT in the last 72 hours. Urine analysis:    Component Value Date/Time   COLORURINE YELLOW 06/25/2016 1710   APPEARANCEUR CLEAR 06/25/2016 1710   LABSPEC 1.023 06/25/2016 1710   PHURINE 5.0 06/25/2016 1710   GLUCOSEU NEGATIVE 06/25/2016 1710   HGBUR NEGATIVE 06/25/2016 1710   BILIRUBINUR NEGATIVE 06/25/2016 1710   KETONESUR  NEGATIVE 06/25/2016 1710   PROTEINUR NEGATIVE 06/25/2016 1710   UROBILINOGEN 1.0 03/28/2012 1119   NITRITE NEGATIVE 06/25/2016 1710   LEUKOCYTESUR NEGATIVE 06/25/2016 1710   Sepsis Labs: @LABRCNTIP (procalcitonin:4,lacticidven:4)  ) Recent Results (from the past 240 hour(s))  Urine culture     Status: Abnormal   Collection Time: 06/23/16  9:08 AM  Result Value Ref Range Status   Specimen Description URINE, CLEAN CATCH  Final   Special Requests NONE  Final   Culture MULTIPLE SPECIES PRESENT, SUGGEST RECOLLECTION (A)  Final   Report Status 06/24/2016 FINAL  Final  MRSA PCR Screening     Status: None   Collection Time: 06/25/16  8:28 PM  Result Value Ref Range Status   MRSA by PCR  NEGATIVE NEGATIVE Final    Comment:        The GeneXpert MRSA Assay (FDA approved for NASAL specimens only), is one component of a comprehensive MRSA colonization surveillance program. It is not intended to diagnose MRSA infection nor to guide or monitor treatment for MRSA infections.      Invalid input(s): PROCALCITONIN, LACTICACIDVEN   Radiology Studies: Mr Brain Wo Contrast  Result Date: 06/25/2016 CLINICAL DATA:  Seizure EXAM: MRI HEAD WITHOUT CONTRAST TECHNIQUE: Multiplanar, multiecho pulse sequences of the brain and surrounding structures were obtained without intravenous contrast. COMPARISON:  CT 06/17/2013.  MRI 07/31/2014 FINDINGS: The patient was not able to complete the exam. Several sequences were obtained which are degraded by motion. Brain: Small subcentimeter areas of restricted diffusion in the left frontal cortex over the convexity most compatible with small areas of acute infarct likely embolic. Generalized atrophy with ventricular enlargement which is stable from the prior study. Mild chronic microvascular ischemic changes in the white matter. Chronic infarct in the right frontal lobe unchanged. No fluid collection or shift of the midline structures. No mass lesion identified.  Vascular: Increased signal the right internal carotid artery compatible with occlusion as noted on the prior study. Left cavernous carotid difficult to evaluate due to motion. Skull and upper cervical spine: Negative Sinuses/Orbits: Negative Other: None IMPRESSION: Examination is limited by motion. In addition, patient was not able to complete the study. Small subcentimeter areas of restricted diffusion in the left frontal cortex over the convexity most compatible with acute infarct. 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 Left cavernous carotid could be occluded but is difficult to evaluate due to motion. CTA suggested for further evaluation. Electronically Signed   By: Marlan Palau M.D.   On: 06/25/2016 16:44   Ct Head Code Stroke W/o Cm  Result Date: 06/25/2016 CLINICAL DATA:  Code stroke. 83 year old female with weakness, confusion, slurred speech. Initial encounter. EXAM: CT HEAD WITHOUT CONTRAST TECHNIQUE: Contiguous axial images were obtained from the base of the skull through the vertex without intravenous contrast. COMPARISON:  06/23/2016 and earlier. FINDINGS: Brain: No acute intracranial hemorrhage identified. No midline shift, mass effect, or evidence of intracranial mass lesion. Chronic appearing encephalomalacia in the inferior right cerebellum and anterior right frontal operculum appear stable since the brain MRI on 07/31/2014. Mild for age superimposed bilateral white matter hypodensity appears stable. Stable cerebral volume. Stable ventricle size and configuration. No cortically based acute infarct identified. Vascular: Calcified atherosclerosis at the skull base. No suspicious intracranial vascular hyperdensity. Skull: Stable.  No acute osseous abnormality identified. Sinuses/Orbits: Visualized paranasal sinuses and mastoids are stable and well pneumatized. Other: Negative orbit and scalp soft tissues. ASPECTS (Alberta Stroke Program Early CT Score) - Ganglionic level infarction  (caudate, lentiform nuclei, internal capsule, insula, M1-M3 cortex): 7 - Supraganglionic infarction (M4-M6 cortex): 3 Total score (0-10 with 10 being normal): 10 IMPRESSION: 1. Stable non contrast CT appearance of the brain, no acute intracranial hemorrhage or cortically based infarct identified. 2. ASPECTS is 10. 3. The above was relayed via text pager to Dr. Jerel Shepherd on 06/25/2016 at 13:50 . Electronically Signed   By: Odessa Fleming M.D.   On: 06/25/2016 13:51        Scheduled Meds: . apixaban  5 mg Oral BID  . aspirin  300 mg Rectal Daily   Or  . aspirin  325 mg Oral Daily  . atorvastatin  20 mg Oral q1800  . citalopram  20 mg Oral q morning - 10a  .  lacosamide (VIMPAT) IV  100 mg Intravenous Q12H  . metoprolol succinate  25 mg Oral Daily   And  . metoprolol succinate  50 mg Oral Daily  . multivitamin with minerals  1 tablet Oral Daily  . pantoprazole  40 mg Oral Daily  . sodium chloride flush  3 mL Intravenous Q12H   Continuous Infusions: . sodium chloride 10 mL/hr at 06/25/16 2319     LOS: 0 days    Time spent: 35 minutes    Caddie Randle A, MD [facility] Pager [phone]  If 7PM-7AM, please contact night-coverage [email or web address] Password [email or web address] 06/26/2016, 12:19 PM

## 2016-06-26 NOTE — Evaluation (Signed)
Clinical/Bedside Swallow Evaluation Patient Details  Name: Heidi Miranda MRN: [id number] Date of Birth: 07-27-1932  Today's Date: 06/26/2016 Time: SLP Start Time (ACUTE ONLY): 1030 SLP Stop Time (ACUTE ONLY): 1110 SLP Time Calculation (min) (ACUTE ONLY): 40 min  Past Medical History:  Past Medical History:  Diagnosis Date  . Anxiety and depression   . Atrial fibrillation (HCC)   . Back pain   . Chronic kidney disease   . Chronic neck pain   . CVA (cerebral infarction)    , right parietal region  . GERD (gastroesophageal reflux disease)   . Hemorrhoids   . Hyperlipidemia   . Hypertension   . Hypertension   . OA (osteoarthritis) of knee   . Sleep apnea    , (Split 02/10/06 AHI 24/hr, O2 mi n 89%; CPAP 7 cm water pressure)  . TIA (transient ischemic attack)    Past Surgical History:  Past Surgical History:  Procedure Laterality Date  . KNEE SURGERY     HPI:  83 y.o.femalewith PMH of HTN, HLD, DM, Afib on eliquis, OSA, dementia followed with Dr. Lucia Miranda at [facility] presented for code stroke. Obtained further information from EMS and husband. Apparently pt had multiple seizure episodes with falls at home for the last one week, and today found to have right facial droop and right UE weakness after a GTC episode. She again had another GTC seizure on CT table. MRI shows Small subcentimeter areas of restricted diffusion in the left frontal cortex over the convexity most compatible with acute infarct.   Assessment / Plan / Recommendation Clinical Impression  Pt demonstrates poor arousal and required max interactions to mantain attention and arousal for PO intake. After oral care and with hand over hand and total assist pt was able to to take puree without difficulty. Pt was able to take sips of water with mixed results. Pt had some mild anterior spillage with cup sips of liquids, but managed a straw well. She took 4 oz of water without signs of aspiration, but then did have a probable sensed  aspiration event with sips of thin at the end of session. Given fluctuations in arousal and awareness, recommend pt consume crushed meds in puree and be allowed ice chips and sips of water with RN when alert. Will need to see more consistent arousal before meals can begin. Will follow for readiness.  SLP Visit Diagnosis: Dysphagia, unspecified (R13.10)    Aspiration Risk  Mild aspiration risk    Diet Recommendation NPO except meds;Ice chips PRN after oral care   Liquid Administration via: Straw Medication Administration: Crushed with puree Supervision: Full supervision/cueing for compensatory strategies Compensations: Slow rate;Small sips/bites;Minimize environmental distractions Postural Changes: Seated upright at 90 degrees    Other  Recommendations     Follow up Recommendations Skilled Nursing facility      Frequency and Duration min 2x/week  2 weeks       Prognosis Prognosis for Safe Diet Advancement: Good Barriers to Reach Goals: Cognitive deficits      Swallow Study   General HPI: 83 y.o.femalewith PMH of HTN, HLD, DM, Afib on eliquis, OSA, dementia followed with Dr. Lucia Miranda at [facility] presented for code stroke. Obtained further information from EMS and husband. Apparently pt had multiple seizure episodes with falls at home for the last one week, and today found to have right facial droop and right UE weakness after a GTC episode. She again had another GTC seizure on CT table. MRI shows Small subcentimeter areas of  restricted diffusion in the left frontal cortex over the convexity most compatible with acute infarct. Type of Study: Bedside Swallow Evaluation Previous Swallow Assessment: none Diet Prior to this Study: NPO Temperature Spikes Noted: No Respiratory Status: Room air History of Recent Intubation: No Behavior/Cognition: Lethargic/Drowsy;Distractible;Requires cueing;Confused;Pleasant mood Oral Cavity Assessment: Dry Oral Care Completed by SLP: Yes Oral Cavity -  Dentition: Adequate natural dentition Vision: Impaired for self-feeding Self-Feeding Abilities: Needs assist Patient Positioning: Upright in bed Baseline Vocal Quality: Normal Volitional Cough: Strong Volitional Swallow: Unable to elicit    Oral/Motor/Sensory Function Overall Oral Motor/Sensory Function: Mild impairment Facial ROM: Within Functional Limits Facial Symmetry: Abnormal symmetry right Facial Strength: Within Functional Limits Facial Sensation: Within Functional Limits Lingual ROM: Within Functional Limits Lingual Symmetry: Within Functional Limits Lingual Strength: Within Functional Limits Lingual Sensation: Within Functional Limits Velum: Within Functional Limits Mandible: Within Functional Limits   Ice Chips Ice chips: Not tested   Thin Liquid Thin Liquid: Impaired Presentation: Cup;Straw;Self Fed Oral Phase Impairments: Reduced labial seal;Poor awareness of bolus Oral Phase Functional Implications: Right anterior spillage Pharyngeal  Phase Impairments: Cough - Immediate    Nectar Thick Nectar Thick Liquid: Not tested   Honey Thick Honey Thick Liquid: Not tested   Puree Puree: Within functional limits Presentation: Spoon   Solid   GO   Solid: Impaired Oral Phase Impairments: Poor awareness of bolus       Harlon DittyBonnie Mikele Sifuentes, MA CCC-SLP [phone]  Heidi Miranda, Heidi NearingBonnie Miranda 06/26/2016,11:45 AM

## 2016-06-26 NOTE — Care Management Note (Addendum)
Case Management Note  Patient Details  Name: Heidi Miranda MRN: [id number] Date of Birth: 06/07/1932  Subjective/Objective:    From home with spouse, presents with seizures, ct (-), per neuro-,Small subcentimeter areas of restricted diffusion in the left frontal cortex over the convexity most compatible with acute infarct. 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 Left cavernous carotid could be occluded but is difficult to evaluate due to motion., failed bedside swallow, speech seeing patient. conts on vimpat iv, Per pt/ot eval rec SNF, CSW referral.                 Action/Plan:   Expected Discharge Date:                  Expected Discharge Plan:  Skilled Nursing Facility  In-House Referral:  Clinical Social Work  Discharge planning Services  CM Consult  Post Acute Care Choice:    Choice offered to:     DME Arranged:    DME Agency:     HH Arranged:    HH Agency:     Status of Service:  Completed, signed off  If discussed at [employer] Meetings, dates discussed:    Additional Comments:  Leone Havenaylor, Eamon Tantillo Clinton, RN 06/26/2016, 4:56 PM

## 2016-06-26 NOTE — Evaluation (Signed)
Physical Therapy Evaluation Patient Details Name: Heidi Miranda MRN: [id number] DOB: Jan 21, 1933 Today's Date: 06/26/2016   History of Present Illness  Pt is an 83 y/o female admitted with tonic-clonic seizure activity. MRI + for acute L frontal CVA. PMH: HTN, dementia, afib on Eliquis, anxiety, depression, DM, OSA.  Clinical Impression  Pt presented supine in bed with HOB flat, awake and willing to participate in therapy session. However, pt very confused throughout and no family/caregivers present. Pt currently requires mod-max A x2 for bed mobility and mod A x2 for sit-to-stand transfers. All VSS throughout. Pt would continue to benefit from skilled physical therapy services at this time while admitted and after d/c to address the below listed limitations in order to improve overall safety and independence with functional mobility.    Follow Up Recommendations SNF;Supervision/Assistance - 24 hour    Equipment Recommendations  None recommended by PT    Recommendations for Other Services       Precautions / Restrictions Precautions Precautions: Fall Restrictions Weight Bearing Restrictions: No      Mobility  Bed Mobility Overal bed mobility: Needs Assistance Bed Mobility: Supine to Sit;Sit to Supine     Supine to sit: +2 for physical assistance;Max assist Sit to supine: +2 for physical assistance;Mod assist   General bed mobility comments: assist to initiate and for all aspects, pt offering more assistance returning to supine   Transfers Overall transfer level: Needs assistance Equipment used: 2 person hand held assist Transfers: Sit to/from Stand Sit to Stand: +2 safety/equipment;Mod assist         General transfer comment: assist to rise and steady, pt unable to take steps toward [facility], pt with c/o knee pain  Ambulation/Gait                Stairs            Wheelchair Mobility    Modified Rankin (Stroke Patients Only)       Balance Overall  balance assessment: Needs assistance Sitting-balance support: Feet supported Sitting balance-Leahy Scale: Poor Sitting balance - Comments: max initially, progressed to min assist, pt pushing towards her R with L UE Postural control: Right lateral lean;Posterior lean Standing balance support: During functional activity Standing balance-Leahy Scale: Poor Standing balance comment: stood x 1 for 10 seconds with mod A x2                             Pertinent Vitals/Pain Pain Assessment: Faces Faces Pain Scale: Hurts little more Pain Location: R knee Pain Descriptors / Indicators: Sore Pain Intervention(s): Monitored during session    Home Living Family/patient expects to be discharged to:: Unsure                 Additional Comments: pt from home, no family available; pt confused throughout and unable to state    Prior Function           Comments: no family available for PLOF or home set up     Hand Dominance   Dominant Hand: Right    Extremity/Trunk Assessment   Upper Extremity Assessment Upper Extremity Assessment: Defer to OT evaluation RUE Deficits / Details: 2/5 shoulder, 2+/5 elbow, 3-/5 gross grasp, attempts to bring hand to mouth spontaneously RUE Coordination: decreased fine motor;decreased gross motor LUE Deficits / Details: generalized weakness LUE Coordination: decreased fine motor;decreased gross motor    Lower Extremity Assessment Lower Extremity Assessment: Generalized weakness  Cervical / Trunk Assessment Cervical / Trunk Assessment: Kyphotic  Communication   Communication: Expressive difficulties  Cognition Arousal/Alertness: Lethargic Behavior During Therapy: Restless Overall Cognitive Status: No family/caregiver present to determine baseline cognitive functioning Area of Impairment: Orientation;Attention;Following commands;Safety/judgement;Problem solving;Awareness                 Orientation Level: Disoriented  to;Place;Time;Situation     Following Commands: Follows one step commands inconsistently Safety/Judgement: Decreased awareness of safety;Decreased awareness of deficits   Problem Solving: Slow processing;Decreased initiation;Difficulty sequencing;Requires verbal cues;Requires tactile cues General Comments: pt is disoriented to time, place, situation, she follow commands inconsistently      General Comments      Exercises     Assessment/Plan    PT Assessment Patient needs continued PT services  PT Problem List Decreased strength;Decreased activity tolerance;Decreased balance;Decreased mobility;Decreased coordination;Decreased cognition;Decreased knowledge of use of DME;Decreased safety awareness;Decreased knowledge of precautions;Cardiopulmonary status limiting activity;Pain;Decreased range of motion       PT Treatment Interventions DME instruction;Gait training;Stair training;Functional mobility training;Therapeutic activities;Therapeutic exercise;Balance training;Neuromuscular re-education;Cognitive remediation;Patient/family education    PT Goals (Current goals can be found in the Care Plan section)  Acute Rehab PT Goals Patient Stated Goal: to take a nap PT Goal Formulation: Patient unable to participate in goal setting Time For Goal Achievement: 07/10/16 Potential to Achieve Goals: Fair    Frequency Min 2X/week   Barriers to discharge        Co-evaluation PT/OT/SLP Co-Evaluation/Treatment: Yes Reason for Co-Treatment: Complexity of the patient's impairments (multi-system involvement);Necessary to address cognition/behavior during functional activity;For patient/therapist safety;To address functional/ADL transfers PT goals addressed during session: Mobility/safety with mobility;Balance;Strengthening/ROM OT goals addressed during session: ADL's and self-care       End of Session   Activity Tolerance: Patient limited by fatigue Patient left: in bed;with call  bell/phone within reach;with bed alarm set Nurse Communication: Mobility status PT Visit Diagnosis: Other abnormalities of gait and mobility (R26.89);Muscle weakness (generalized) (M62.81);Other symptoms and signs involving the nervous system (R29.898)    Time: [id number] PT Time Calculation (min) (ACUTE ONLY): 26 min   Charges:   PT Evaluation $PT Eval Moderate Complexity: 1 Procedure     PT G Codes:   PT G-Codes **NOT FOR INPATIENT CLASS** Functional Assessment Tool Used: AM-PAC 6 Clicks Basic Mobility;Clinical judgement Functional Limitation: Mobility: Walking and moving around;Changing and maintaining body position Mobility: Walking and Moving Around Current Status [phone](G8978): At least 60 percent but less than 80 percent impaired, limited or restricted Mobility: Walking and Moving Around Goal Status [phone](G8979): At least 40 percent but less than 60 percent impaired, limited or restricted Changing and Maintaining Body Position Current Status ([id number](G8981): At least 60 percent but less than 80 percent impaired, limited or restricted Changing and Maintaining Body Position Goal Status ([id number](G8982): At least 40 percent but less than 60 percent impaired, limited or restricted    [facility] Hospital [facility] DefianceJennifer Paytan Recine, [place] CarolinaPT, DPT [phone]   Heidi BevelsJennifer M Arkel Miranda 06/26/2016, 10:24 AM

## 2016-06-26 NOTE — Evaluation (Signed)
Occupational Therapy Evaluation Patient Details Name: Heidi Miranda MRN: [id number] DOB: 10/20/1932 Today's Date: 06/26/2016    History of Present Illness Pt is an 83 y/o female admitted with tonic-clonic seizure activity. MRI + for acute L frontal CVA. PMH: HTN, dementia, afib on Eliquis, anxiety, depression, DM, OSA.   Clinical Impression   No family available to offer information regarding PLOF. Pt awakened for therapy and participated well, but returned to sleeping immediately upon return to supine. She requires 2 person assist for all mobility and demonstrates poor sitting and standing balance. She has significant R UE weakness. Pt is dependent in all ADL. She will need post acute rehab in SNF upon discharge. Will follow.    Follow Up Recommendations  SNF;Supervision/Assistance - 24 hour    Equipment Recommendations       Recommendations for Other Services       Precautions / Restrictions Precautions Precautions: Fall Restrictions Weight Bearing Restrictions: No      Mobility Bed Mobility Overal bed mobility: Needs Assistance Bed Mobility: Supine to Sit;Sit to Supine     Supine to sit: +2 for physical assistance;Max assist Sit to supine: +2 for physical assistance;Mod assist   General bed mobility comments: assist to initiate and for all aspects, pt offering more assistance returning to supine   Transfers Overall transfer level: Needs assistance Equipment used: 2 person hand held assist Transfers: Sit to/from Stand Sit to Stand: +2 safety/equipment;Mod assist         General transfer comment: assist to rise and steady, pt [facility], pt with c/o knee pain    Balance Overall balance assessment: Needs assistance Sitting-balance support: Feet supported Sitting balance-Leahy Scale: Poor Sitting balance - Comments: max initially, progressed to min assist, pt pushing towards her R with L UE Postural control: Right lateral lean;Posterior  lean Standing balance support: During functional activity Standing balance-Leahy Scale: Poor Standing balance comment: stood x 1 for 10 seconds with mod A x2                           ADL either performed or assessed with clinical judgement   ADL                                         General ADL Comments: Pt requires total care. Brushed pt's teeth and hair at EOB. Currently NPO.     Vision   Vision Assessment?: No apparent visual deficits     Perception     Praxis      Pertinent Vitals/Pain Pain Assessment: No/denies pain Faces Pain Scale: Hurts little more Pain Location: R knee Pain Descriptors / Indicators: Sore Pain Intervention(s): Monitored during session     Hand Dominance Right   Extremity/Trunk Assessment Upper Extremity Assessment Upper Extremity Assessment: Defer to OT evaluation RUE Deficits / Details: 2/5 shoulder, 2+/5 elbow, 3-/5 gross grasp, attempts to bring hand to mouth spontaneously RUE Coordination: decreased fine motor;decreased gross motor LUE Deficits / Details: generalized weakness LUE Coordination: decreased fine motor;decreased gross motor   Lower Extremity Assessment Lower Extremity Assessment: Generalized weakness   Cervical / Trunk Assessment Cervical / Trunk Assessment: Kyphotic   Communication Communication Communication: Expressive difficulties   Cognition Arousal/Alertness: Lethargic Behavior During Therapy: Restless Overall Cognitive Status: Impaired/Different from baseline Area of Impairment: Orientation;Attention;Following commands;Safety/judgement;Problem solving;Awareness  Orientation Level: Disoriented to;Place;Time;Situation     Following Commands: Follows one step commands inconsistently Safety/Judgement: Decreased awareness of safety;Decreased awareness of deficits   Problem Solving: Slow processing;Decreased initiation;Difficulty sequencing;Requires verbal  cues;Requires tactile cues General Comments: pt is disoriented to time, place, situation, she follow commands inconsistently   General Comments       Exercises     Shoulder Instructions      Home Living Family/patient expects to be discharged to:: Unsure                                 Additional Comments: pt from home, no family available; pt confused throughout and unable to state  Lives With: Spouse    Prior Functioning/Environment          Comments: no family available for PLOF or home set up        OT Problem List: Decreased strength;Decreased activity tolerance;Impaired balance (sitting and/or standing);Decreased knowledge of use of DME or AE;Decreased cognition;Decreased safety awareness;Decreased coordination;Pain;Impaired UE functional use;Impaired tone      OT Treatment/Interventions: Self-care/ADL training;DME and/or AE instruction;Patient/family education;Balance training;Cognitive remediation/compensation;Therapeutic activities;Neuromuscular education    OT Goals(Current goals can be found in the care plan section) Acute Rehab OT Goals Patient Stated Goal: to take a nap OT Goal Formulation: Patient unable to participate in goal setting Time For Goal Achievement: 07/10/16 Potential to Achieve Goals: Fair ADL Goals Pt Will Perform Eating: with mod assist;sitting (when cleared for POs) Pt Will Perform Grooming: with mod assist;sitting Pt Will Transfer to Toilet: with mod assist;stand pivot transfer;bedside commode Pt/caregiver will Perform Home Exercise Program: Increased strength;Right Upper extremity;With minimal assist Additional ADL Goal #1: Pt will perform bed mobility with moderate assistance in preparation for ADL. Additional ADL Goal #2: Pt will follow one step commands with 50% of trials.  OT Frequency: Min 3X/week   Barriers to D/C:            Co-evaluation PT/OT/SLP Co-Evaluation/Treatment: Yes Reason for Co-Treatment: Complexity  of the patient's impairments (multi-system involvement);Necessary to address cognition/behavior during functional activity;For patient/therapist safety;To address functional/ADL transfers PT goals addressed during session: Mobility/safety with mobility;Balance;Strengthening/ROM OT goals addressed during session: ADL's and self-care      End of Session Equipment Utilized During Treatment: Gait belt;Rolling walker Nurse Communication: Mobility status  Activity Tolerance: Patient tolerated treatment well Patient left: in bed;with call bell/phone within reach;with chair alarm set  OT Visit Diagnosis: Unsteadiness on feet (R26.81);Muscle weakness (generalized) (M62.81);Cognitive communication deficit (R41.841);Other symptoms and signs involving cognitive function;Pain;Hemiplegia and hemiparesis Symptoms and signs involving cognitive functions: Cerebral infarction Hemiplegia - Right/Left: Right Hemiplegia - dominant/non-dominant: Dominant Hemiplegia - caused by: Cerebral infarction Pain - Right/Left: Right Pain - part of body: Knee                Time: 1610-9604 OT Time Calculation (min): 26 min Charges:  OT General Charges $OT Visit: 1 Procedure OT Evaluation $OT Eval Moderate Complexity: 1 Procedure G-Codes: OT G-codes **NOT FOR INPATIENT CLASS** Functional Assessment Tool Used: Clinical judgement Functional Limitation: Self care Self Care Current Status (V4098): 100 percent impaired, limited or restricted Self Care Goal Status (J1914): At least 40 percent but less than 60 percent impaired, limited or restricted    Evern Bio 06/26/2016, 12:16 PM  [phone]

## 2016-06-26 NOTE — Progress Notes (Signed)
Bedside EEG completed, results pending. 

## 2016-06-26 NOTE — Evaluation (Signed)
Speech Language Pathology Evaluation Patient Details Name: Glenice LaineDixie B Gudino MRN: [id number] DOB: 04/01/1932 Today's Date: 06/26/2016 Time: 1030-1110 SLP Time Calculation (min) (ACUTE ONLY): 40 min  Problem List:  Patient Active Problem List   Diagnosis Date Noted  . Seizure (HCC) 06/25/2016  . H/O: CVA (cerebrovascular accident) 06/25/2016  . Dementia without behavioral disturbance 06/25/2016  . Weakness generalized 06/25/2016  . Acute CVA (cerebrovascular accident) (HCC) 06/25/2016  . Chronic anticoagulation 11/15/2014  . Cognitive decline 07/15/2014  . Atrial fibrillation (HCC) 03/28/2012  . Acute CHF (HCC) 03/28/2012  . SOB (shortness of breath) 03/28/2012  . HTN (hypertension) 03/28/2012  . Forgetfulness 03/28/2012   Past Medical History:  Past Medical History:  Diagnosis Date  . Anxiety and depression   . Atrial fibrillation (HCC)   . Back pain   . Chronic kidney disease   . Chronic neck pain   . CVA (cerebral infarction)    , right parietal region  . GERD (gastroesophageal reflux disease)   . Hemorrhoids   . Hyperlipidemia   . Hypertension   . Hypertension   . OA (osteoarthritis) of knee   . Sleep apnea    , (Split 02/10/06 AHI 24/hr, O2 mi n 89%; CPAP 7 cm water pressure)  . TIA (transient ischemic attack)    Past Surgical History:  Past Surgical History:  Procedure Laterality Date  . KNEE SURGERY     HPI:  83 y.o.femalewith PMH of HTN, HLD, DM, Afib on eliquis, OSA, dementia [facility] presented for code stroke. Obtained further information from EMS and husband. Apparently pt had multiple seizure episodes with falls at home for the last one week, and today found to have right facial droop and right UE weakness after a GTC episode. She again had another GTC seizure on CT table. MRI shows Small subcentimeter areas of restricted diffusion in the left frontal cortex over the convexity most compatible with acute infarct.   Assessment / Plan /  Recommendation Clinical Impression  Pt demonstrates mixed deficits including baseline dementia, poor arousal possibly impacted by medications given yesterday, as well as new finding of left frontal CVA. Given possible multifactorial causes it is difficult to isolate language impairment to dignose possible mild expressive aphasia. Pt does have cognitive and verbal perseveration and occasional paraphasias with phrase level speech. Dysarthria is not evident when pt is fully alert. Attention is focused and only briefly sustained to verbal and functional tasks despite max verbal and tactile cueing. Recommend f/u in acute setting for further diagnostic treatment and consideration for SNF level f/u.     SLP Assessment  SLP Recommendation/Assessment: Patient needs continued Speech Lanaguage Pathology Services SLP Visit Diagnosis: Cognitive communication deficit (R41.841)    Follow Up Recommendations  Skilled Nursing facility    Frequency and Duration min 2x/week  2 weeks      SLP Evaluation Cognition  Overall Cognitive Status: Impaired/Different from baseline Arousal/Alertness: Suspect due to medications Orientation Level: Oriented to person;Oriented to place;Disoriented to time;Disoriented to situation Attention: Focused Focused Attention: Appears intact Memory: Impaired Memory Impairment: Decreased short term memory Decreased Short Term Memory: Verbal basic;Functional basic Awareness: Impaired Awareness Impairment: Intellectual impairment;Emergent impairment;Anticipatory impairment Problem Solving: Impaired Problem Solving Impairment: Verbal basic;Functional basic Executive Function: Initiating;Self Monitoring Initiating: Impaired Safety/Judgment: Impaired       Comprehension  Auditory Comprehension Overall Auditory Comprehension: Impaired Yes/No Questions: Within Functional Limits Commands: Impaired One Step Basic Commands: 25-49% accurate Interfering Components:  Attention EffectiveTechniques: Extra processing time;Repetition;Visual/Gestural cues Visual  Recognition/Discrimination Discrimination: Within Function Limits Reading Comprehension Reading Status: Not tested    Expression Verbal Expression Overall Verbal Expression: Impaired Initiation: No impairment Automatic Speech: Name;Social Response Level of Generative/Spontaneous Verbalization: Phrase Repetition: Impaired Level of Impairment: Phrase level Naming: Impairment Responsive: Not tested Confrontation: Impaired Verbal Errors: Language of confusion;Perseveration;Not aware of errors;Aware of errors Pragmatics: No impairment Interfering Components: Attention;Speech intelligibility Written Expression Dominant Hand: Right Written Expression: Not tested   Oral / Motor  Oral Motor/Sensory Function Overall Oral Motor/Sensory Function: Mild impairment Facial ROM: Within Functional Limits Facial Symmetry: Abnormal symmetry right Facial Strength: Within Functional Limits Facial Sensation: Within Functional Limits Lingual ROM: Within Functional Limits Lingual Symmetry: Within Functional Limits Lingual Strength: Within Functional Limits Lingual Sensation: Within Functional Limits Velum: Within Functional Limits Mandible: Within Functional Limits Motor Speech Overall Motor Speech: Appears within functional limits for tasks assessed   GO                   Harlon Ditty, MA CCC-SLP [phone]  Claudine Mouton 06/26/2016, 11:56 AM

## 2016-06-26 NOTE — Progress Notes (Signed)
Patient failed bedside swallow evaluation. Unable to give PO meds until patient is cleared by speech therapy.   Leanna BattlesEckelmann, Cordaro Mukai Eileen, RN.

## 2016-06-26 NOTE — Progress Notes (Signed)
STROKE TEAM PROGRESS NOTE   HISTORY OF PRESENT ILLNESS (per record) Heidi Miranda is a 83 y.o. Caucasian female with PMH of HTN, HLD, DM, Afib on eliquis, OSA, dementia followed with Dr. Lucia Gaskins at [facility] presented for code stroke. I also called husband to get more information in additional to received from EMS.   Pt apparently had 4-5 seizure episode with falls for the last week, and the seizure seemed more frequent overtime. She was here in ED 2 days ago after fall, CT head no bleeding and she was discharged home. (seems that pt or family did not provide seizure event with ED physician at that time). This morning, she was eating lunch and husband found her to constantly drop her fork on the right hand with intermittent shaking, eventually when she was helped to bathroom at 11:00am, she had full blown seizure with LOC and whole body shaking. It lasted 5 min. After the seizure, husband found pt to have right facial droop and right arm weakness. Husband worried about stroke and EMS was called.  On EMS arrival, glucose 286, BP 144/106, right arm drift but right facial droop was not seen, did not follow commands well. Pt has chronic right LE weakness as per husband. EKG showed afib. En route, she received 600cc bolus and repeat Glucose 210.   On ED arrival, she was awake alert, attending to both side, no neglect, but disorientation (could be at baseline as per husband). Still has right UE drift but with asterixis also. Went to CT head, no acute abnormalities. However, she had again tonic clonic seizure on the CT table and lasted about 2 min. 2mg  Ativan given and will load with fosphenytoin.   Pt follows with cardiology for afib and changed from Xarelto to Eliquis in 03/2016. Husband said she did not take today dose yet but compliant with medication and took yesterday dose. She also follows with Dr. Lucia Gaskins at [facility] for dementia, was recommended aricept and namenda but not on her medication list.   She was LKW  at 11:00 am 06/25/2016. Patient was not administered IV t-PA secondary to being on eliquis and presentation felt to be seizure and todd's paralysis. She was admitted for further evaluation and treatment.   SUBJECTIVE (INTERVAL HISTORY) No family is at bedside. Pt is sleepy drowsy but able to arouse on stimulation. Fluent speech but also not orientated and confused on verbal content. Not sure about her mental baseline at home with dementia. Husband is not at bedside.    OBJECTIVE Temp:  [97.7 F (36.5 C)-98.4 F (36.9 C)] 97.7 F (36.5 C) (03/29 0335) Pulse Rate:  [65-109] 89 (03/29 0335) Cardiac Rhythm: Atrial fibrillation (03/29 0335) Resp:  [14-19] 17 (03/29 0335) BP: (114-184)/(61-96) 184/96 (03/29 0500) SpO2:  [96 %-100 %] 99 % (03/29 0335) Weight:  [81.6 kg (179 lb 14.3 oz)] 81.6 kg (179 lb 14.3 oz) (03/28 1300)  CBC:  Recent Labs Lab 06/25/16 1335 06/25/16 1341 06/26/16 0329  WBC 8.0  --  6.6  NEUTROABS 6.0  --   --   HGB 13.3 12.6 12.7  HCT 39.6 37.0 38.2  MCV 91.9  --  92.0  PLT 140*  --  130*    Basic Metabolic Panel:  Recent Labs Lab 06/25/16 1335 06/25/16 1341 06/26/16 0329  NA 141 144 145  K 3.9 3.9 3.5  CL 109 106 109  CO2 23  --  26  GLUCOSE 122* 121* 104*  BUN 10 12 9   CREATININE 1.20* 1.20*  0.85  CALCIUM 8.8*  --  8.7*    Lipid Panel:    Component Value Date/Time   CHOL 111 06/26/2016 0329   TRIG 50 06/26/2016 0329   HDL 38 (L) 06/26/2016 0329   CHOLHDL 2.9 06/26/2016 0329   VLDL 10 06/26/2016 0329   LDLCALC 63 06/26/2016 0329   HgbA1c:  Lab Results  Component Value Date   HGBA1C 5.6 03/28/2012   Urine Drug Screen:    Component Value Date/Time   LABOPIA NONE DETECTED 06/25/2016 1710   COCAINSCRNUR NONE DETECTED 06/25/2016 1710   LABBENZ NONE DETECTED 06/25/2016 1710   AMPHETMU NONE DETECTED 06/25/2016 1710   THCU NONE DETECTED 06/25/2016 1710   LABBARB NONE DETECTED 06/25/2016 1710      IMAGING I have personally reviewed the  radiological images below and agree with the radiology interpretations.  Ct Head Code Stroke W/o Cm 06/25/2016 1. Stable non contrast CT appearance of the brain, no acute intracranial hemorrhage or cortically based infarct identified. 2. ASPECTS is 10.   Mr Brain Wo Contrast 06/25/2016 Examination is limited by motion. In addition, patient was not able to complete the study. Small subcentimeter areas of restricted diffusion in the left frontal cortex over the convexity most compatible with acute infarct. 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 Left cavernous carotid could be occluded but is difficult to evaluate due to motion.   CTA head and neck pending  TTE pending  EEG pending   PHYSICAL EXAM  Temp:  [97.6 F (36.4 C)-98.4 F (36.9 C)] 97.7 F (36.5 C) (03/29 1147) Pulse Rate:  [65-109] 85 (03/29 0850) Resp:  [14-19] 17 (03/29 0335) BP: (114-184)/(61-96) 134/76 (03/29 0850) SpO2:  [96 %-100 %] 100 % (03/29 0850)  General - Well nourished, well developed, sleepy but able to arouse with voice stimulation and interactive with provider.  Ophthalmologic - Fundi not visualized due to noncooperation.  Cardiovascular - irregularly irregular heart rate and rhythm.  Neuro - sleepy but arousable with vioce, attending to both sides, no neglect, follows simple commands, orientated to self and age, but not time, place or people. fluent with speech, but confused on verbal content. Blinking to visual threat bilaterally, PERRL, eyes moving bilaterally, no facial droop, tongue in midline in mouth. LUE 5/5 able to hold against gravity without drift, RUE drift to bed before 5 sce, however, if able to get her concentrate on RUE, she can hold it against gravity without drift but with asterixis. BLE 2+/5 on pain stimulation. Babinski positive bilaterally, DTR 1+, sensation, coordination not cooperative and gait not tested. Nurse stated that pt was able to sit at bedside and standup, but not  cooperative to walk with PT.   ASSESSMENT/PLAN Ms. Heidi Miranda is a 83 y.o. female with history of HTN, HLD, DM, Afib on eliquis, OSA, dementia presenting with  right facial droop and right UE weakness after a generalized tonic clonic seizure. She did not receive IV t-PA due to seizure presentation, on Eliquis.   Seizure with ? Todd's Paralysis  Multiple seizures with falls for past 1 week  Generalized tonic clonic seizure day of admission followed by R hemiparesis (face and arm)  Another GTC seizure after arrival on CT scan  Ativan followed by fosphenytoin load  Changed to Vimpat given dementia  Continue vimpat 100mg  bid  Seizure precautions  EEG pending  Stroke:  3-4 punctate left frontal ACA and MCA/ACA infarct secondary to afib on eliquis vs. Large vessel disease with ? Left cavernous ICA  occlusion  Resultant  RUE drift with suspicious for RUE neglect  Code stroke CT no acute abnormality. Old right superior MCA infarct. Aspects 10  MRI  Motion degraded. Punctate L frontal cortex infarcts. Chronic R cavernous occlusion. Possible L cavernous occlusion.  CTA head and neck pending  2D Echo  pending  LDL 63  HgbA1c pending  Eliquis for VTE prophylaxis  Diet NPO time specified  Eliquis (apixaban) daily prior to admission, now on Eliquis (apixaban) 5mg  bid.  Patient counseled to be compliant with her antithrombotic medications  Ongoing aggressive stroke risk factor management  Therapy recommendations:  pending   Disposition:  pending   Follow up Dr. Lucia Gaskins at [facility]  Atrial Fibrillation  Home anticoagulation:  Eliquis (apixaban) daily continued in the hospital  Continue eliquis at discharge   Dementia  Not sure exactly baseline at home  Followed with Dr. Lucia Gaskins for dementia  Recommended aricept and nemanda at that time but not on pt medication list  Will need further information from husband  Hypertension  Stable  Permissive hypertension (OK if  <180/105) for 24-48 hours post stroke and then gradually normalized within 5-7 days.  Long term BP goal 130-150 due to right ICA cavernous occlusion   Hyperlipidemia  Home meds:  lipitor 20, resumed in hospital  LDL 63, goal < 70  Continue statin at discharge  Diabetes  HgbA1c pending, goal < 7.0  SSI  CBG monitoring  Other Stroke Risk Factors  Advanced age  ETOH use, advised to drink no more than 1 drink(s) a day  Hx stroke/TIA, CT old right superior MCA infarct, timing unknown  Family hx stroke (Maternal and Paternal Grandmothers)  Obstructive sleep apnea, on CPAP at home  Other Active Problems  Followed by Dr. Lucia Gaskins at [facility] day # 0  Marvel Plan, MD PhD Stroke Neurology 06/26/2016 1:43 PM   To contact Stroke Continuity provider, please refer to [email or web address]. After hours, contact General Neurology

## 2016-06-26 NOTE — Progress Notes (Signed)
  Echocardiogram 2D Echocardiogram has been performed.  Janalyn HarderWest, Sharrell Krawiec R 06/26/2016, 2:45 PM

## 2016-06-27 ENCOUNTER — Encounter (HOSPITAL_COMMUNITY): Payer: Self-pay | Admitting: General Practice

## 2016-06-27 DIAGNOSIS — I6523 Occlusion and stenosis of bilateral carotid arteries: Secondary | ICD-10-CM

## 2016-06-27 LAB — GLUCOSE, CAPILLARY
GLUCOSE-CAPILLARY: 133 mg/dL — AB (ref 65–99)
GLUCOSE-CAPILLARY: 99 mg/dL (ref 65–99)
Glucose-Capillary: 120 mg/dL — ABNORMAL HIGH (ref 65–99)
Glucose-Capillary: 115 mg/dL — ABNORMAL HIGH (ref 65–99)
Glucose-Capillary: 138 mg/dL — ABNORMAL HIGH (ref 65–99)

## 2016-06-27 LAB — CBC
HEMATOCRIT: 44 % (ref 36.0–46.0)
Hemoglobin: 14.6 g/dL (ref 12.0–15.0)
MCH: 30.2 pg (ref 26.0–34.0)
MCHC: 33.2 g/dL (ref 30.0–36.0)
MCV: 91.1 fL (ref 78.0–100.0)
Platelets: 176 10*3/uL (ref 150–400)
RBC: 4.83 MIL/uL (ref 3.87–5.11)
RDW: 13.4 % (ref 11.5–15.5)
WBC: 12 10*3/uL — AB (ref 4.0–10.5)

## 2016-06-27 LAB — BASIC METABOLIC PANEL
Anion gap: 11 (ref 5–15)
BUN: 8 mg/dL (ref 6–20)
CHLORIDE: 109 mmol/L (ref 101–111)
CO2: 24 mmol/L (ref 22–32)
Calcium: 9.3 mg/dL (ref 8.9–10.3)
Creatinine, Ser: 1.01 mg/dL — ABNORMAL HIGH (ref 0.44–1.00)
GFR calc Af Amer: 59 mL/min — ABNORMAL LOW (ref >60)
GFR calc non Af Amer: 51 mL/min — ABNORMAL LOW (ref >60)
GLUCOSE: 147 mg/dL — AB (ref 65–99)
POTASSIUM: 4 mmol/L (ref 3.5–5.1)
SODIUM: 144 mmol/L (ref 135–145)

## 2016-06-27 LAB — HEMOGLOBIN A1C
Hgb A1c MFr Bld: 5.4 % (ref 4.8–5.6)
Mean Plasma Glucose: 108 mg/dL

## 2016-06-27 MED ORDER — QUETIAPINE FUMARATE 25 MG PO TABS
25.0000 mg | ORAL_TABLET | Freq: Every day | ORAL | Status: DC
  Administered 2016-06-27 – 2016-06-29 (×3): 25 mg via ORAL
  Filled 2016-06-27 (×3): qty 1

## 2016-06-27 MED ORDER — METOPROLOL TARTRATE 25 MG PO TABS
25.0000 mg | ORAL_TABLET | Freq: Two times a day (BID) | ORAL | Status: DC
  Administered 2016-06-27 – 2016-06-30 (×7): 25 mg via ORAL
  Filled 2016-06-27 (×7): qty 1

## 2016-06-27 MED ORDER — PANTOPRAZOLE SODIUM 40 MG IV SOLR
40.0000 mg | INTRAVENOUS | Status: DC
  Administered 2016-06-27 – 2016-06-30 (×4): 40 mg via INTRAVENOUS
  Filled 2016-06-27 (×4): qty 40

## 2016-06-27 NOTE — Discharge Instructions (Signed)

## 2016-06-27 NOTE — Progress Notes (Signed)
PROGRESS NOTE  Heidi Miranda  WUJ:[id number] DOB: Aug 15, 1932 DOA: 06/25/2016 PCP: Sissy Hoff, MD Outpatient Specialists:  Subjective: Awake but disoriented this morning appears to be doing better in the afternoon. SLP evaluated her again and recommended to continue nothing by mouth.  Brief Narrative:  Heidi Miranda is a 83 y.o. female with medical history significant of HTN, A. fib on Eliquis and history of stroke (finding on the CT scan) she was brought to the hospital because of tonic-clonic seizures. Of note patient came into the hospital 2 days ago because of falls and upon further questioning husband mentioned she was having shakes. Initially patient came in as a code stroke, apparently earlier today she was eating her lunch with her husband and she dropped her for consult start shaking, and after that she had full blown seizures. Last about 5 minutes. She had another episode in the ED. Per EMS she had right facial droop was not seen in the ED here.  Assessment & Plan:   Principal Problem:   Seizure (HCC) Active Problems:   Atrial fibrillation (HCC)   HTN (hypertension)   Cognitive decline   H/O: CVA (cerebrovascular accident)   Dementia without behavioral disturbance   Weakness generalized   Acute CVA (cerebrovascular accident) (HCC)   Probable acute CVA -Patient presented with new onset seizure and right facial droop (was not seen in the ED). -MRI although degraded by motion showed areas of restricted diffusion in the left frontal cortex compatible with acute infarct. -She has atrial fibrillation, she is on Eliquis, per daughter not sure if she takes all her medications. -Await the rest of the stroke workup.  Seizures -New onset seizure, generalized seizures, with postictal state. -Nephrology consulted, risk factors including dementia and history of stroke. -Recent CT (done on 3/26) showed old infarct. -Given Ativan, fosphenytoin, started on Vimpat by  neurology.  Atrial fibrillation -Rate is controlled, heart rate mainly in the 80s with one bout of 100 teens. -CHA2DS2-VASc is at least 6, female, 2+ age, 2+ stroke and HTN. -She is on Eliquis, continued.  Dementia without behavioral disturbances -Spoke with her daughter at bedside, she describes classic short-term memory loss consistent with dementia. -This is been worsening in the past 1 year.  Hypertension -Blood pressure control, continue home medications.  Generalized weakness -Recent UTI, treated with ciprofloxacin, urine culture showed no growth to date, stop antibiotics, recheck UA and cultures.  OSA -Patient tried CPAP before was not able to use it, will hold on using it for now.  Dysphagia -Continue nothing by mouth per SLP recommendation.  DVT prophylaxis:  Code Status: DNR Family Communication:  Disposition Plan:  Diet: Diet NPO time specified Except for: Ice Chips  Consultants:   Neuro  Procedures:   EEG consistent with nonspecific mild generalized cerebral dysfunction  Antimicrobials:   None  Objective: Vitals:   06/26/16 1932 06/26/16 2305 06/27/16 0338 06/27/16 0723  BP: 105/72 111/89 (!) 142/93 133/81  Pulse: 95 (!) 101  (!) 113  Resp: (!) 24 (!) 21 (!) 28 17  Temp: 98.5 F (36.9 C) 98.4 F (36.9 C) 97.2 F (36.2 C) 97.8 F (36.6 C)  TempSrc: Oral Oral Axillary Oral  SpO2: 98% 99%  93%  Weight:      Height:        Intake/Output Summary (Last 24 hours) at 06/27/16 1228 Last data filed at 06/26/16 2300  Gross per 24 hour  Intake  35 ml  Output                0 ml  Net               35 ml   Filed Weights   06/25/16 1300  Weight: 81.6 kg (179 lb 14.3 oz)    Examination: General exam: Appears calm and comfortable  Respiratory system: Clear to auscultation. Respiratory effort normal. Cardiovascular system: S1 & S2 heard, RRR. No JVD, murmurs, rubs, gallops or clicks. No pedal edema. Gastrointestinal system:  Abdomen is nondistended, soft and nontender. No organomegaly or masses felt. Normal bowel sounds heard. Central nervous system: Alert and oriented. No focal neurological deficits. Extremities: Symmetric 5 x 5 power. Skin: No rashes, lesions or ulcers Psychiatry: Judgement and insight appear normal. Mood & affect appropriate.   Data Reviewed: I have personally reviewed following labs and imaging studies  CBC:  Recent Labs Lab 06/25/16 1335 06/25/16 1341 06/26/16 0329 06/27/16 0315  WBC 8.0  --  6.6 12.0*  NEUTROABS 6.0  --   --   --   HGB 13.3 12.6 12.7 14.6  HCT 39.6 37.0 38.2 44.0  MCV 91.9  --  92.0 91.1  PLT 140*  --  130* 176   Basic Metabolic Panel:  Recent Labs Lab 06/25/16 1335 06/25/16 1341 06/26/16 0329 06/27/16 0315  NA 141 144 145 144  K 3.9 3.9 3.5 4.0  CL 109 106 109 109  CO2 23  --  26 24  GLUCOSE 122* 121* 104* 147*  BUN 10 12 9 8   CREATININE 1.20* 1.20* 0.85 1.01*  CALCIUM 8.8*  --  8.7* 9.3   GFR: Estimated Creatinine Clearance: 47 mL/min (A) (by C-G formula based on SCr of 1.01 mg/dL (H)). Liver Function Tests:  Recent Labs Lab 06/25/16 1335  AST 24  ALT 18  ALKPHOS 90  BILITOT 1.0  PROT 6.0*  ALBUMIN 3.5   No results for input(s): LIPASE, AMYLASE in the last 168 hours. No results for input(s): AMMONIA in the last 168 hours. Coagulation Profile:  Recent Labs Lab 06/25/16 1335 06/25/16 2041  INR 1.44 1.34   Cardiac Enzymes: No results for input(s): CKTOTAL, CKMB, CKMBINDEX, TROPONINI in the last 168 hours. BNP (last 3 results) No results for input(s): PROBNP in the last 8760 hours. HbA1C:  Recent Labs  06/25/16 2041  HGBA1C 5.4   CBG:  Recent Labs Lab 06/26/16 2351 06/27/16 0722  GLUCAP 133* 120*   Lipid Profile:  Recent Labs  06/26/16 0329  CHOL 111  HDL 38*  LDLCALC 63  TRIG 50  CHOLHDL 2.9   Thyroid Function Tests:  Recent Labs  06/25/16 2041  TSH 1.717   Anemia Panel: No results for input(s):  VITAMINB12, FOLATE, FERRITIN, TIBC, IRON, RETICCTPCT in the last 72 hours. Urine analysis:    Component Value Date/Time   COLORURINE YELLOW 06/25/2016 1710   APPEARANCEUR CLEAR 06/25/2016 1710   LABSPEC 1.023 06/25/2016 1710   PHURINE 5.0 06/25/2016 1710   GLUCOSEU NEGATIVE 06/25/2016 1710   HGBUR NEGATIVE 06/25/2016 1710   BILIRUBINUR NEGATIVE 06/25/2016 1710   KETONESUR NEGATIVE 06/25/2016 1710   PROTEINUR NEGATIVE 06/25/2016 1710   UROBILINOGEN 1.0 03/28/2012 1119   NITRITE NEGATIVE 06/25/2016 1710   LEUKOCYTESUR NEGATIVE 06/25/2016 1710   Sepsis Labs: @LABRCNTIP (procalcitonin:4,lacticidven:4)  ) Recent Results (from the past 240 hour(s))  Urine culture     Status: Abnormal   Collection Time: 06/23/16  9:08 AM  Result Value Ref Range Status  Specimen Description URINE, CLEAN CATCH  Final   Special Requests NONE  Final   Culture MULTIPLE SPECIES PRESENT, SUGGEST RECOLLECTION (A)  Final   Report Status 06/24/2016 FINAL  Final  MRSA PCR Screening     Status: None   Collection Time: 06/25/16  8:28 PM  Result Value Ref Range Status   MRSA by PCR NEGATIVE NEGATIVE Final    Comment:        The GeneXpert MRSA Assay (FDA approved for NASAL specimens only), is one component of a comprehensive MRSA colonization surveillance program. It is not intended to diagnose MRSA infection nor to guide or monitor treatment for MRSA infections.      Invalid input(s): PROCALCITONIN, LACTICACIDVEN   Radiology Studies: Ct Angio Head W Or Wo Contrast  Result Date: 06/27/2016 CLINICAL DATA:  83 y/o F; history of stroke, TIA, and atrial fibrillation. EXAM: CT ANGIOGRAPHY HEAD AND NECK TECHNIQUE: Multidetector CT imaging of the head and neck was performed using the standard protocol during bolus administration of intravenous contrast. Multiplanar CT image reconstructions and MIPs were obtained to evaluate the vascular anatomy. Carotid stenosis measurements (when applicable) are obtained  utilizing NASCET criteria, using the distal internal carotid diameter as the denominator. CONTRAST:  50 cc Isovue 370 COMPARISON:  06/25/2016 MRI of the brain. FINDINGS: CT HEAD FINDINGS Brain: Small right frontal lobe and right inferior cerebellar hemisphere chronic infarcts. Nonspecific left posterior temporal calcification, likely sequelae of prior infectious/inflammatory process. Moderate chronic microvascular ischemic changes of white matter and moderate brain parenchymal volume loss. Small infarcts on prior MRI are not appreciable on CT. No hydrocephalus, intracranial hemorrhage, or effacement of basilar cisterns. No evidence for new large territory infarct. Incidental subcentimeter anterior falx lipoma. Vascular: As below. Skull: Normal. Negative for fracture or focal lesion. Sinuses: Imaged portions are clear. Orbits: Bilateral intra-ocular lens replacement. Review of the MIP images confirms the above findings CTA NECK FINDINGS Aortic arch: Aberrant right subclavian artery. Mild calcific atherosclerosis of the aortic arch. No evidence for dissection, vasculitis, or significant stenosis of great vessel origins. Right carotid system: Patent right common carotid artery. Complete occlusion of the right internal carotid artery two-view terminus. Left carotid system: Patent left common carotid artery. Near complete occlusion of the left internal carotid artery in the neck with intermittent thread-like opacity. Vertebral arteries: Left dominant. Dense calcification of right vertebral artery origin with probable stenosis, accurate assessment is limited due to the dense calcification. Widely patent left vertebral artery. Skeleton: Cervical spondylosis with moderate bilateral facet degenerative changes most pronounced at the C3 through C5 levels and severe discogenic degenerative changes at C5 through C7. Uncovertebral hypertrophy results in foraminal narrowing greatest at the left C6-7 and right C5-6 levels. No  high-grade bony canal stenosis. Other neck: Negative. Upper chest: Negative. Review of the MIP images confirms the above findings CTA HEAD FINDINGS Anterior circulation: Complete occlusion of the right internal carotid artery to the terminus. The left internal carotid artery is occluded in the petrous and cavernous segments with minimal patency of paraclinoid and terminal segments. Bilateral anterior cerebral artery and middle cerebral artery distributions are patent although the vessels are diffusely diminished in caliber. Posterior circulation: Mild irregularity of the basilar artery with short segment of mid stenosis of the mid segment. Mild bilateral P1 segments of stenosis. Otherwise no evidence for high-grade stenosis, aneurysm, vascular malformation, or proximal occlusion. Venous sinuses: As permitted by contrast timing, patent. Anatomic variants: Small anterior communicating artery. No posterior communicating artery identified, likely hypoplastic or absent.  Delayed phase: No abnormal intracranial enhancement. Review of the MIP images confirms the above findings IMPRESSION: 1. Small right frontal and right inferior cerebellar hemisphere chronic infarcts. 2. Small acute infarcts on the prior MRI of the brain are not appreciable on CT. 3. Moderate chronic microvascular ischemic changes and moderate parenchymal volume loss of the brain. 4. Complete occlusion of right internal carotid artery to the terminus and near complete occlusion of the left internal carotid artery with minimal patency of left paraclinoid and terminal segments. 5. Patent circle of Willis without large vessel occlusion, aneurysm, or vascular malformation. 6. Diffusely diminished caliber of the anterior cerebral artery and middle cerebral artery distributions. 7. Moderate midbasilar and bilateral P1 segment foci of stenosis compatible with intracranial atherosclerosis. These results will be called to the ordering clinician or representative by  the Radiologist Assistant, and communication documented in the PACS or zVision Dashboard. Electronically Signed   By: Mitzi Hansen M.D.   On: 06/27/2016 00:02   Ct Angio Neck W Or Wo Contrast  Result Date: 06/27/2016 CLINICAL DATA:  83 y/o F; history of stroke, TIA, and atrial fibrillation. EXAM: CT ANGIOGRAPHY HEAD AND NECK TECHNIQUE: Multidetector CT imaging of the head and neck was performed using the standard protocol during bolus administration of intravenous contrast. Multiplanar CT image reconstructions and MIPs were obtained to evaluate the vascular anatomy. Carotid stenosis measurements (when applicable) are obtained utilizing NASCET criteria, using the distal internal carotid diameter as the denominator. CONTRAST:  50 cc Isovue 370 COMPARISON:  06/25/2016 MRI of the brain. FINDINGS: CT HEAD FINDINGS Brain: Small right frontal lobe and right inferior cerebellar hemisphere chronic infarcts. Nonspecific left posterior temporal calcification, likely sequelae of prior infectious/inflammatory process. Moderate chronic microvascular ischemic changes of white matter and moderate brain parenchymal volume loss. Small infarcts on prior MRI are not appreciable on CT. No hydrocephalus, intracranial hemorrhage, or effacement of basilar cisterns. No evidence for new large territory infarct. Incidental subcentimeter anterior falx lipoma. Vascular: As below. Skull: Normal. Negative for fracture or focal lesion. Sinuses: Imaged portions are clear. Orbits: Bilateral intra-ocular lens replacement. Review of the MIP images confirms the above findings CTA NECK FINDINGS Aortic arch: Aberrant right subclavian artery. Mild calcific atherosclerosis of the aortic arch. No evidence for dissection, vasculitis, or significant stenosis of great vessel origins. Right carotid system: Patent right common carotid artery. Complete occlusion of the right internal carotid artery two-view terminus. Left carotid system: Patent  left common carotid artery. Near complete occlusion of the left internal carotid artery in the neck with intermittent thread-like opacity. Vertebral arteries: Left dominant. Dense calcification of right vertebral artery origin with probable stenosis, accurate assessment is limited due to the dense calcification. Widely patent left vertebral artery. Skeleton: Cervical spondylosis with moderate bilateral facet degenerative changes most pronounced at the C3 through C5 levels and severe discogenic degenerative changes at C5 through C7. Uncovertebral hypertrophy results in foraminal narrowing greatest at the left C6-7 and right C5-6 levels. No high-grade bony canal stenosis. Other neck: Negative. Upper chest: Negative. Review of the MIP images confirms the above findings CTA HEAD FINDINGS Anterior circulation: Complete occlusion of the right internal carotid artery to the terminus. The left internal carotid artery is occluded in the petrous and cavernous segments with minimal patency of paraclinoid and terminal segments. Bilateral anterior cerebral artery and middle cerebral artery distributions are patent although the vessels are diffusely diminished in caliber. Posterior circulation: Mild irregularity of the basilar artery with short segment of mid stenosis of the mid  segment. Mild bilateral P1 segments of stenosis. Otherwise no evidence for high-grade stenosis, aneurysm, vascular malformation, or proximal occlusion. Venous sinuses: As permitted by contrast timing, patent. Anatomic variants: Small anterior communicating artery. No posterior communicating artery identified, likely hypoplastic or absent. Delayed phase: No abnormal intracranial enhancement. Review of the MIP images confirms the above findings IMPRESSION: 1. Small right frontal and right inferior cerebellar hemisphere chronic infarcts. 2. Small acute infarcts on the prior MRI of the brain are not appreciable on CT. 3. Moderate chronic microvascular  ischemic changes and moderate parenchymal volume loss of the brain. 4. Complete occlusion of right internal carotid artery to the terminus and near complete occlusion of the left internal carotid artery with minimal patency of left paraclinoid and terminal segments. 5. Patent circle of Willis without large vessel occlusion, aneurysm, or vascular malformation. 6. Diffusely diminished caliber of the anterior cerebral artery and middle cerebral artery distributions. 7. Moderate midbasilar and bilateral P1 segment foci of stenosis compatible with intracranial atherosclerosis. These results will be called to the ordering clinician or representative by the Radiologist Assistant, and communication documented in the PACS or zVision Dashboard. Electronically Signed   By: Mitzi Hansen M.D.   On: 06/27/2016 00:02   Mr Brain Wo Contrast  Result Date: 06/25/2016 CLINICAL DATA:  Seizure EXAM: MRI HEAD WITHOUT CONTRAST TECHNIQUE: Multiplanar, multiecho pulse sequences of the brain and surrounding structures were obtained without intravenous contrast. COMPARISON:  CT 06/17/2013.  MRI 07/31/2014 FINDINGS: The patient was not able to complete the exam. Several sequences were obtained which are degraded by motion. Brain: Small subcentimeter areas of restricted diffusion in the left frontal cortex over the convexity most compatible with small areas of acute infarct likely embolic. Generalized atrophy with ventricular enlargement which is stable from the prior study. Mild chronic microvascular ischemic changes in the white matter. Chronic infarct in the right frontal lobe unchanged. No fluid collection or shift of the midline structures. No mass lesion identified. Vascular: Increased signal the right internal carotid artery compatible with occlusion as noted on the prior study. Left cavernous carotid difficult to evaluate due to motion. Skull and upper cervical spine: Negative Sinuses/Orbits: Negative Other: None  IMPRESSION: Examination is limited by motion. In addition, patient was not able to complete the study. Small subcentimeter areas of restricted diffusion in the left frontal cortex over the convexity most compatible with acute infarct. 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 Left cavernous carotid could be occluded but is difficult to evaluate due to motion. CTA suggested for further evaluation. Electronically Signed   By: Marlan Palau M.D.   On: 06/25/2016 16:44   Ct Head Code Stroke W/o Cm  Result Date: 06/25/2016 CLINICAL DATA:  Code stroke. 83 year old female with weakness, confusion, slurred speech. Initial encounter. EXAM: CT HEAD WITHOUT CONTRAST TECHNIQUE: Contiguous axial images were obtained from the base of the skull through the vertex without intravenous contrast. COMPARISON:  06/23/2016 and earlier. FINDINGS: Brain: No acute intracranial hemorrhage identified. No midline shift, mass effect, or evidence of intracranial mass lesion. Chronic appearing encephalomalacia in the inferior right cerebellum and anterior right frontal operculum appear stable since the brain MRI on 07/31/2014. Mild for age superimposed bilateral white matter hypodensity appears stable. Stable cerebral volume. Stable ventricle size and configuration. No cortically based acute infarct identified. Vascular: Calcified atherosclerosis at the skull base. No suspicious intracranial vascular hyperdensity. Skull: Stable.  No acute osseous abnormality identified. Sinuses/Orbits: Visualized paranasal sinuses and mastoids are stable and well pneumatized. Other: Negative orbit  and scalp soft tissues. ASPECTS ([place] Stroke Program Early CT Score) - Ganglionic level infarction (caudate, lentiform nuclei, internal capsule, insula, M1-M3 cortex): 7 - Supraganglionic infarction (M4-M6 cortex): 3 Total score (0-10 with 10 being normal): 10 IMPRESSION: 1. Stable non contrast CT appearance of the brain, no acute intracranial hemorrhage  or cortically based infarct identified. 2. ASPECTS is 10. 3. The above was relayed via text pager to Dr. Jerel Shepherd on 06/25/2016 at 13:50 . Electronically Signed   By: Odessa Fleming M.D.   On: 06/25/2016 13:51        Scheduled Meds: . apixaban  5 mg Oral BID  . atorvastatin  20 mg Oral q1800  . chlorhexidine  15 mL Mouth Rinse BID  . citalopram  20 mg Oral q morning - 10a  . insulin aspart  0-9 Units Subcutaneous TID WC  . lacosamide (VIMPAT) IV  100 mg Intravenous Q12H  . metoprolol tartrate  25 mg Oral BID  . multivitamin with minerals  1 tablet Oral Daily  . pantoprazole (PROTONIX) IV  40 mg Intravenous Q24H  . sodium chloride flush  3 mL Intravenous Q12H   Continuous Infusions: . sodium chloride 10 mL/hr at 06/25/16 2319     LOS: 1 day    Time spent: 35 minutes    Supreme Rybarczyk A, MD [facility] Pager [phone]  If 7PM-7AM, please contact night-coverage [email or web address] Password [facility] 06/27/2016, 12:28 PM

## 2016-06-27 NOTE — Progress Notes (Signed)
  Speech Language Pathology Treatment: Dysphagia;Cognitive-Linquistic  Patient Details Name: Heidi Miranda MRN: [id number] DOB: [birth year] Today's Date: 06/27/2016 Time: 4098-1191 SLP Time Calculation (min) (ACUTE ONLY): 10 min  Assessment / Plan / Recommendation Clinical Impression  Pt demonstrates no improvement, slightly worsened arousal than yesterday. With stimuli (repositioning, verbal interaction, face washing) pt is still responsive, but does not open eyes today and is more resistant, yelling out sometimes. SLP provided tactile cues to improve awareness of ice chip with minimal success; pt eventually masticated and swallowed chip, but response was delayed and incomplete. Left pt upright with bed alarm on, lights on, blinds open to facilitate awake periods during the day. WIll not be able to advance diet today. May need to consider short term alternate nutrition if arousal does not improve.   HPI HPI: 83 y.o.femalewith PMH of HTN, HLD, DM, Afib on eliquis, OSA, dementia followed with Dr. Lucia Gaskins at [facility] presented for code stroke. Obtained further information from EMS and husband. Apparently pt had multiple seizure episodes with falls at home for the last one week, and today found to have right facial droop and right UE weakness after a GTC episode. She again had another GTC seizure on CT table. MRI shows Small subcentimeter areas of restricted diffusion in the left frontal cortex over the convexity most compatible with acute infarct.      SLP Plan  Continue with current plan of care       Recommendations  Diet recommendations: NPO Medication Administration: Crushed with puree Supervision: Full supervision/cueing for compensatory strategies;Staff to assist with self feeding Compensations: Slow rate;Small sips/bites;Minimize environmental distractions Postural Changes and/or Swallow Maneuvers: Seated upright 90 degrees                Follow up Recommendations: Skilled Nursing  facility SLP Visit Diagnosis: Cognitive communication deficit (Y78.295) Plan: Continue with current plan of care       GO               [facility] MA CCC-SLP [phone]  Claudine Mouton 06/27/2016, 10:00 AM

## 2016-06-27 NOTE — Progress Notes (Signed)
STROKE TEAM PROGRESS NOTE   HISTORY OF PRESENT ILLNESS (per record) Heidi Miranda is a 83 y.o. Caucasian female with PMH of HTN, HLD, DM, Afib on eliquis, OSA, dementia followed with Dr. Lucia Gaskins at [facility] presented for code stroke. I also called husband to get more information in additional to received from EMS.   Pt apparently had 4-5 seizure episode with falls for the last week, and the seizure seemed more frequent overtime. She was here in ED 2 days ago after fall, CT head no bleeding and she was discharged home. (seems that pt or family did not provide seizure event with ED physician at that time). This morning, she was eating lunch and husband found her to constantly drop her fork on the right hand with intermittent shaking, eventually when she was helped to bathroom at 11:00am, she had full blown seizure with LOC and whole body shaking. It lasted 5 min. After the seizure, husband found pt to have right facial droop and right arm weakness. Husband worried about stroke and EMS was called.  On EMS arrival, glucose 286, BP 144/106, right arm drift but right facial droop was not seen, did not follow commands well. Pt has chronic right LE weakness as per husband. EKG showed afib. En route, she received 600cc bolus and repeat Glucose 210.   On ED arrival, she was awake alert, attending to both side, no neglect, but disorientation (could be at baseline as per husband). Still has right UE drift but with asterixis also. Went to CT head, no acute abnormalities. However, she had again tonic clonic seizure on the CT table and lasted about 2 min.  Ativan given and will load with fosphenytoin.   Pt follows with cardiology for afib and changed from Xarelto to Eliquis in 03/2016. Husband said she did not take today dose yet but compliant with medication and took yesterday dose. She also follows with Dr. Lucia Gaskins at [facility] for dementia, was recommended aricept and namenda but not on her medication list.   She was LKW  at 11:00 am 06/25/2016. Patient was not administered IV t-PA secondary to being on eliquis and presentation felt to be seizure and todd's paralysis. She was admitted for further evaluation and treatment.   SUBJECTIVE (INTERVAL HISTORY) No family is at bedside. Pt is still sleepy drowsy but able to arouse and open eyes on stimulation. Told me "I am fine". However, not cooperative on exam, "leave me alone", "it hurts".    OBJECTIVE Temp:  [97.2 F (36.2 C)-98.5 F (36.9 C)] 97.9 F (36.6 C) (03/30 1300) Pulse Rate:  [92-113] 92 (03/30 1300) Cardiac Rhythm: Atrial fibrillation (03/30 0746) Resp:  [14-28] 23 (03/30 1300) BP: (105-144)/(72-100) 144/77 (03/30 1300) SpO2:  [93 %-99 %] 98 % (03/30 1300)  CBC:  Recent Labs Lab 06/25/16 1335  06/26/16 0329 06/27/16 0315  WBC 8.0  --  6.6 12.0*  NEUTROABS 6.0  --   --   --   HGB 13.3  < > 12.7 14.6  HCT 39.6  < > 38.2 44.0  MCV 91.9  --  92.0 91.1  PLT 140*  --  130* 176  < > = values in this interval not displayed.  Basic Metabolic Panel:   Recent Labs Lab 06/26/16 0329 06/27/16 0315  NA 145 144  K 3.5 4.0  CL 109 109  CO2 26 24  GLUCOSE 104* 147*  BUN 9 8  CREATININE 0.85 1.01*  CALCIUM 8.7* 9.3    Lipid Panel:  Component Value Date/Time   CHOL 111 06/26/2016 0329   TRIG 50 06/26/2016 0329   HDL 38 (L) 06/26/2016 0329   CHOLHDL 2.9 06/26/2016 0329   VLDL 10 06/26/2016 0329   LDLCALC 63 06/26/2016 0329   HgbA1c:  Lab Results  Component Value Date   HGBA1C 5.4 06/25/2016   Urine Drug Screen:     Component Value Date/Time   LABOPIA NONE DETECTED 06/25/2016 1710   COCAINSCRNUR NONE DETECTED 06/25/2016 1710   LABBENZ NONE DETECTED 06/25/2016 1710   AMPHETMU NONE DETECTED 06/25/2016 1710   THCU NONE DETECTED 06/25/2016 1710   LABBARB NONE DETECTED 06/25/2016 1710      IMAGING I have personally reviewed the radiological images below and agree with the radiology interpretations.  Ct Head Code Stroke W/o  Cm 06/25/2016 1. Stable non contrast CT appearance of the brain, no acute intracranial hemorrhage or cortically based infarct identified. 2. ASPECTS is 10.   Mr Brain Wo Contrast 06/25/2016 Examination is limited by motion. In addition, patient was not able to complete the study. Small subcentimeter areas of restricted diffusion in the left frontal cortex over the convexity most compatible with acute infarct. 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 Left cavernous carotid could be occluded but is difficult to evaluate due to motion.   Ct Angio Head and neck W Or Wo Contrast 06/27/2016 IMPRESSION: 1. Small right frontal and right inferior cerebellar hemisphere chronic infarcts. 2. Small acute infarcts on the prior MRI of the brain are not appreciable on CT. 3. Moderate chronic microvascular ischemic changes and moderate parenchymal volume loss of the brain. 4. Complete occlusion of right internal carotid artery to the terminus and near complete occlusion of the left internal carotid artery with minimal patency of left paraclinoid and terminal segments. 5. Patent circle of Willis without large vessel occlusion, aneurysm, or vascular malformation. 6. Diffusely diminished caliber of the anterior cerebral artery and middle cerebral artery distributions. 7. Moderate midbasilar and bilateral P1 segment foci of stenosis compatible with intracranial atherosclerosis.   TEE - Left ventricle: Diffuse hypokinesis worse in the inferior wall   likely ischemic MR The cavity size was mildly dilated. Wall   thickness was increased in a pattern of mild LVH. Systolic   function was moderately to severely reduced. The estimated   ejection fraction was in the range of 30% to 35%. Diffuse   hypokinesis. - Aortic valve: There was mild regurgitation. - Mitral valve: There was moderate to severe regurgitation. Valve   area by continuity equation (using LVOT flow): 1.97 cm^2. - Right atrium: The atrium was mildly  dilated. - Atrial septum: No defect or patent foramen ovale was identified. - Tricuspid valve: There was moderate-severe regurgitation. - Pulmonary arteries: PA peak pressure: 47 mm Hg (S).  EEG - 1) mild generalized irregular slow activity Clinical Interpretation: This EEG is consistent with a mild generalized nonspecific cerebral dysfunction (encephalopathy).. There was no seizure or seizure predisposition recorded on this study. Please note that a normal EEG does not preclude the possibility of epilepsy.    PHYSICAL EXAM  Temp:  [97.2 F (36.2 C)-98.5 F (36.9 C)] 97.9 F (36.6 C) (03/30 1300) Pulse Rate:  [92-113] 92 (03/30 1300) Resp:  [14-28] 23 (03/30 1300) BP: (105-144)/(72-100) 144/77 (03/30 1300) SpO2:  [93 %-99 %] 98 % (03/30 1300)  General - Well nourished, well developed, sleepy but able to arouse with voice stimulation and interactive with provider, but not cooperative on exam  Ophthalmologic - Fundi not visualized  due to noncooperation.  Cardiovascular - irregularly irregular heart rate and rhythm.  Neuro - sleepy but arousable with vioce, attending to both sides, no neglect, not cooperative on exam "leave me alone", "I am fine", "it hurts". PERRL, eyes moving bilaterally, no facial droop, tongue in midline in mouth. BUE lack of effort on exam, drift to bed both arms. BLE 2+/5 on pain stimulation. Babinski positive bilaterally, DTR 1+, sensation, coordination not cooperative and gait not tested.   ASSESSMENT/PLAN Heidi Miranda is a 83 y.o. female with history of HTN, HLD, DM, Afib on eliquis, OSA, dementia presenting with  right facial droop and right UE weakness after a generalized tonic clonic seizure. She did not receive IV t-PA due to seizure presentation, on Eliquis.   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 afib even on eliquis. The left ICA occlusion is new from 07/2014. Right ICA occlusion was chronic. However, both ICA occlusion is chronic process as pt had anterior collateral circulation.  Resultant  RUE drift with suspicious for RUE neglect  Code stroke CT no acute abnormality. Old right superior MCA infarct. Aspects 10  MRI  Motion degraded. Punctate L frontal cortex infarcts. Chronic R cavernous occlusion. Possible L cavernous occlusion.  CTA head and neck bilateral ICA occlusion, with diffuse diminished caliber of anterior circulation. The left ICA occlusion is new from 07/2014. Right ICA occlusion was chronic.   2D Echo  EF 30-35%  LDL 63  HgbA1c 5.4  Eliquis for VTE prophylaxis Diet NPO time specified Except for: Ice Chips  Eliquis (apixaban) daily prior to admission, now on Eliquis (apixaban)  bid. Due to b/l ICA chronic occlusion, will recommend ASA  on top of eliquis  bid.  Patient counseled to be compliant with her antithrombotic medications  Ongoing aggressive stroke risk factor management  Therapy recommendations:  pending   Disposition:  pending   Follow up Dr. Lucia Gaskins at [facility] in the past  Atrial Fibrillation  Home anticoagulation:  Eliquis (apixaban) daily continued in the hospital  Continue eliquis at discharge   b/l Carotid occlusion  Right ICA chronic occlusion  Left ICA chronic occlusion but new from 07/2014  Anterior circulation collateral flow but diminished caliber  No surgical intervention needed  Will add ASA  on top of eliquis  Dementia  Not sure exactly baseline at home  Followed with Dr. Lucia Gaskins for dementia  Recommended aricept  and nemanda at that time but not on pt medication list  Will need further information from husband  Hypertension  Stable  Permissive hypertension (OK if <180/105) for 24-48 hours post stroke and then gradually normalized within 5-7 days.  Long term BP goal 130-150 due to right ICA cavernous occlusion   Hyperlipidemia  Home meds:  lipitor 20, resumed in hospital  LDL 63, goal < 70  Continue statin at discharge  Diabetes  HgbA1c 5.4 goal < 7.0  controlled  SSI  CBG monitoring  Other Stroke Risk Factors  Advanced age  ETOH use, advised to drink no more than 1 drink(s) a  day  Hx stroke/TIA, CT old right superior MCA infarct, timing unknown  Family hx stroke (Maternal and Paternal Grandmothers)  Obstructive sleep apnea, on CPAP at home  Other Active Problems  Followed by Dr. Lucia Gaskins at [facility] day # 1  Neurology will sign off. Please call with questions. Pt will follow up with Dr. Lucia Gaskins at [facility] in about 6 weeks. Thanks for the consult.   Marvel Plan, MD PhD Stroke Neurology 06/27/2016 2:54 PM   To contact Stroke Continuity provider, please refer to [email or web address]. After hours, contact General Neurology

## 2016-06-27 NOTE — Progress Notes (Signed)
Report given to receiving RN. Patient transferred to [id number] by SWOT RN Gunnar Fusi. Patient's daughter at bedside. No s/sx of acute distress.

## 2016-06-28 DIAGNOSIS — I6523 Occlusion and stenosis of bilateral carotid arteries: Secondary | ICD-10-CM

## 2016-06-28 LAB — BASIC METABOLIC PANEL
Anion gap: 11 (ref 5–15)
BUN: 13 mg/dL (ref 6–20)
CALCIUM: 8.8 mg/dL — AB (ref 8.9–10.3)
CO2: 24 mmol/L (ref 22–32)
CREATININE: 0.97 mg/dL (ref 0.44–1.00)
Chloride: 110 mmol/L (ref 101–111)
GFR, EST NON AFRICAN AMERICAN: 53 mL/min — AB (ref >60)
GLUCOSE: 102 mg/dL — AB (ref 65–99)
Potassium: 3.9 mmol/L (ref 3.5–5.1)
Sodium: 145 mmol/L (ref 135–145)

## 2016-06-28 LAB — CBC
HCT: 39.5 % (ref 36.0–46.0)
Hemoglobin: 13.2 g/dL (ref 12.0–15.0)
MCH: 30.7 pg (ref 26.0–34.0)
MCHC: 33.4 g/dL (ref 30.0–36.0)
MCV: 91.9 fL (ref 78.0–100.0)
PLATELETS: 123 10*3/uL — AB (ref 150–400)
RBC: 4.3 MIL/uL (ref 3.87–5.11)
RDW: 13.4 % (ref 11.5–15.5)
WBC: 8.2 10*3/uL (ref 4.0–10.5)

## 2016-06-28 LAB — GLUCOSE, CAPILLARY
GLUCOSE-CAPILLARY: 101 mg/dL — AB (ref 65–99)
Glucose-Capillary: 114 mg/dL — ABNORMAL HIGH (ref 65–99)

## 2016-06-28 NOTE — Progress Notes (Signed)
PROGRESS NOTE  Heidi Miranda  ZOX:[id number] DOB: [birth year] DOA: 06/25/2016 PCP: Sissy Hoff, MD Outpatient Specialists:  Subjective: More awake and alert today but still confused and disoriented. Await reevaluation from SLP, if indeed she continues to have significant dysphagia might need to consider PEG tube  Brief Narrative:  Heidi Miranda is a 83 y.o. female with medical history significant of HTN, A. fib on Eliquis and history of stroke (finding on the CT scan) she was brought to the hospital because of tonic-clonic seizures. Of note patient came into the hospital 2 days ago because of falls and upon further questioning husband mentioned she was having shakes. Initially patient came in as a code stroke, apparently earlier today she was eating her lunch with her husband and she dropped her for consult start shaking, and after that she had full blown seizures. Last about 5 minutes. She had another episode in the ED. Per EMS she had right facial droop was not seen in the ED here.  Assessment & Plan:   Principal Problem:   Seizure (HCC) Active Problems:   Atrial fibrillation (HCC)   HTN (hypertension)   Cognitive decline   H/O: CVA (cerebrovascular accident)   Dementia without behavioral disturbance   Weakness generalized   Acute CVA (cerebrovascular accident) (HCC)   Bilateral carotid artery occlusion   Probable acute CVA -Patient presented with new onset seizure and right facial droop (was not seen in the ED). -MRI although degraded by motion showed areas of restricted diffusion in the left frontal cortex compatible with acute infarct. -She has atrial fibrillation, she is on Eliquis, per daughter not sure if she takes all her medications. -Neurology recommended to add low-dose aspirin to Eliquis.  Seizures -New onset seizure, generalized seizures, with postictal state. -Nephrology consulted, risk factors including dementia and history of stroke. -Recent CT (done on 3/26)  showed old infarct. -Given Ativan, fosphenytoin, started on Vimpat by neurology.  Dysphagia -Seen by SLP and recommended nothing by mouth. -Await reevaluation, if continues to recommend NPO, consider PEG tube  Atrial fibrillation -Rate is controlled, heart rate mainly in the 80s with one bout of 100 teens. -CHA2DS2-VASc is at least 6, female, 2+ age, 2+ stroke and HTN. -She is on Eliquis, continued.  Dementia without behavioral disturbances -Spoke with her daughter at bedside, she describes classic short-term memory loss consistent with dementia. -This is been worsening in the past 1 year.  Hypertension -Blood pressure control, continue home medications.  Generalized weakness -Recent UTI, treated with ciprofloxacin, urine culture showed no growth to date, stop antibiotics, recheck UA and cultures.  OSA -Patient tried CPAP before was not able to use it, will hold on using it for now.   DVT prophylaxis:  Code Status: DNR Family Communication:  Disposition Plan:  Diet: Diet NPO time specified Except for: Ice Chips  Consultants:   Neuro  Procedures:   EEG consistent with nonspecific mild generalized cerebral dysfunction  Antimicrobials:   None  Objective: Vitals:   06/27/16 2100 06/28/16 0111 06/28/16 0505 06/28/16 0935  BP: (!) 153/97 102/70 126/78 140/65  Pulse: 84 72 72 70  Resp: Temp: 98.6 F (37 C) 98.1 F (36.7 C) 98.4 F (36.9 C) 98 F (36.7 C)  TempSrc: Axillary Axillary Axillary Axillary  SpO2: 100% 100% 97% 100%  Weight: 78.3 kg (172 lb 9.9 oz)     Height:  (1.676 m)       Intake/Output Summary (Last 24 hours) at 06/28/16  1155 Last data filed at 06/28/16 0500  Gross per 24 hour  Intake              180 ml  Output                0 ml  Net              180 ml   Filed Weights   06/25/16 1300 06/27/16 2100  Weight: 81.6 kg (179 lb 14.3 oz) 78.3 kg (172 lb 9.9 oz)    Examination: General exam: Appears calm and  comfortable  Respiratory system: Clear to auscultation. Respiratory effort normal. Cardiovascular system: S1 & S2 heard, RRR. No JVD, murmurs, rubs, gallops or clicks. No pedal edema. Gastrointestinal system: Abdomen is nondistended, soft and nontender. No organomegaly or masses felt. Normal bowel sounds heard. Central nervous system: Alert and oriented. No focal neurological deficits. Extremities: Symmetric 5 x 5 power. Skin: No rashes, lesions or ulcers Psychiatry: Judgement and insight appear normal. Mood & affect appropriate.   Data Reviewed: I have personally reviewed following labs and imaging studies  CBC:  Recent Labs Lab 06/25/16 1335 06/25/16 1341 06/26/16 0329 06/27/16 0315 06/28/16 0513  WBC 8.0  --  6.6 12.0* 8.2  NEUTROABS 6.0  --   --   --   --   HGB 13.3 12.6 12.7 14.6 13.2  HCT 39.6 37.0 38.2 44.0 39.5  MCV 91.9  --  92.0 91.1 91.9  PLT 140*  --  130* 176 123*   Basic Metabolic Panel:  Recent Labs Lab 06/25/16 1335 06/25/16 1341 06/26/16 0329 06/27/16 0315 06/28/16 0513  NA 141 144 145 144 145  K 3.9 3.9 3.5 4.0 3.9  CL 109 106 109 109 110  CO2 23  --  GLUCOSE 122* 121* 104* 147* 102*  BUN CREATININE 1.20* 1.20* 0.85 1.01* 0.97  CALCIUM 8.8*  --  8.7* 9.3 8.8*   GFR: Estimated Creatinine Clearance: 48 mL/min (by C-G formula based on SCr of 0.97 mg/dL). Liver Function Tests:  Recent Labs Lab 06/25/16 1335  AST 24  ALT 18  ALKPHOS 90  BILITOT 1.0  PROT 6.0*  ALBUMIN 3.5   No results for input(s): LIPASE, AMYLASE in the last 168 hours. No results for input(s): AMMONIA in the last 168 hours. Coagulation Profile:  Recent Labs Lab 06/25/16 1335 06/25/16 2041  INR 1.44 1.34   Cardiac Enzymes: No results for input(s): CKTOTAL, CKMB, CKMBINDEX, TROPONINI in the last 168 hours. BNP (last 3 results) No results for input(s): PROBNP in the last 8760 hours. HbA1C:  Recent Labs  06/25/16 2041  HGBA1C 5.4    CBG:  Recent Labs Lab 06/27/16 1341 06/27/16 1701 06/27/16 2015 06/27/16 2339 06/28/16 0609  GLUCAP 115* 99 138* 133* 114*   Lipid Profile:  Recent Labs  06/26/16 0329  CHOL 111  HDL 38*  LDLCALC 63  TRIG 50  CHOLHDL 2.9   Thyroid Function Tests:  Recent Labs  06/25/16 2041  TSH 1.717   Anemia Panel: No results for input(s): VITAMINB12, FOLATE, FERRITIN, TIBC, IRON, RETICCTPCT in the last 72 hours. Urine analysis:    Component Value Date/Time   COLORURINE YELLOW 06/25/2016 1710   APPEARANCEUR CLEAR 06/25/2016 1710   LABSPEC 1.023 06/25/2016 1710   PHURINE 5.0 06/25/2016 1710   GLUCOSEU NEGATIVE 06/25/2016 1710   HGBUR NEGATIVE 06/25/2016 1710   BILIRUBINUR NEGATIVE 06/25/2016 1710   KETONESUR NEGATIVE 06/25/2016 1710  PROTEINUR NEGATIVE 06/25/2016 1710   UROBILINOGEN 1.0 03/28/2012 1119   NITRITE NEGATIVE 06/25/2016 1710   LEUKOCYTESUR NEGATIVE 06/25/2016 1710   Sepsis Labs: (procalcitonin:4,lacticidven:4)  ) Recent Results (from the past 240 hour(s))  Urine culture     Status: Abnormal   Collection Time: 06/23/16  9:08 AM  Result Value Ref Range Status   Specimen Description URINE, CLEAN CATCH  Final   Special Requests NONE  Final   Culture MULTIPLE SPECIES PRESENT, SUGGEST RECOLLECTION (A)  Final   Report Status 06/24/2016 FINAL  Final  MRSA PCR Screening     Status: None   Collection Time: 06/25/16  8:28 PM  Result Value Ref Range Status   MRSA by PCR NEGATIVE NEGATIVE Final    Comment:        The GeneXpert MRSA Assay (FDA approved for NASAL specimens only), is one component of a comprehensive MRSA colonization surveillance program. It is not intended to diagnose MRSA infection nor to guide or monitor treatment for MRSA infections.      Invalid input(s): PROCALCITONIN, LACTICACIDVEN   Radiology Studies: Ct Angio Head W Or Wo Contrast  Result Date: 06/27/2016 CLINICAL DATA:  83 y/o F; history of stroke, TIA, and atrial  fibrillation. EXAM: CT ANGIOGRAPHY HEAD AND NECK TECHNIQUE: Multidetector CT imaging of the head and neck was performed using the standard protocol during bolus administration of intravenous contrast. Multiplanar CT image reconstructions and MIPs were obtained to evaluate the vascular anatomy. Carotid stenosis measurements (when applicable) are obtained utilizing NASCET criteria, using the distal internal carotid diameter as the denominator. CONTRAST:  50 cc Isovue 370 COMPARISON:  06/25/2016 MRI of the brain. FINDINGS: CT HEAD FINDINGS Brain: Small right frontal lobe and right inferior cerebellar hemisphere chronic infarcts. Nonspecific left posterior temporal calcification, likely sequelae of prior infectious/inflammatory process. Moderate chronic microvascular ischemic changes of white matter and moderate brain parenchymal volume loss. Small infarcts on prior MRI are not appreciable on CT. No hydrocephalus, intracranial hemorrhage, or effacement of basilar cisterns. No evidence for new large territory infarct. Incidental subcentimeter anterior falx lipoma. Vascular: As below. Skull: Normal. Negative for fracture or focal lesion. Sinuses: Imaged portions are clear. Orbits: Bilateral intra-ocular lens replacement. Review of the MIP images confirms the above findings CTA NECK FINDINGS Aortic arch: Aberrant right subclavian artery. Mild calcific atherosclerosis of the aortic arch. No evidence for dissection, vasculitis, or significant stenosis of great vessel origins. Right carotid system: Patent right common carotid artery. Complete occlusion of the right internal carotid artery two-view terminus. Left carotid system: Patent left common carotid artery. Near complete occlusion of the left internal carotid artery in the neck with intermittent thread-like opacity. Vertebral arteries: Left dominant. Dense calcification of right vertebral artery origin with probable stenosis, accurate assessment is limited due to the  dense calcification. Widely patent left vertebral artery. Skeleton: Cervical spondylosis with moderate bilateral facet degenerative changes most pronounced at the C3 through C5 levels and severe discogenic degenerative changes at C5 through C7. Uncovertebral hypertrophy results in foraminal narrowing greatest at the left C6-7 and right C5-6 levels. No high-grade bony canal stenosis. Other neck: Negative. Upper chest: Negative. Review of the MIP images confirms the above findings CTA HEAD FINDINGS Anterior circulation: Complete occlusion of the right internal carotid artery to the terminus. The left internal carotid artery is occluded in the petrous and cavernous segments with minimal patency of paraclinoid and terminal segments. Bilateral anterior cerebral artery and middle cerebral artery distributions are patent although the vessels are diffusely diminished  in caliber. Posterior circulation: Mild irregularity of the basilar artery with short segment of mid stenosis of the mid segment. Mild bilateral P1 segments of stenosis. Otherwise no evidence for high-grade stenosis, aneurysm, vascular malformation, or proximal occlusion. Venous sinuses: As permitted by contrast timing, patent. Anatomic variants: Small anterior communicating artery. No posterior communicating artery identified, likely hypoplastic or absent. Delayed phase: No abnormal intracranial enhancement. Review of the MIP images confirms the above findings IMPRESSION: 1. Small right frontal and right inferior cerebellar hemisphere chronic infarcts. 2. Small acute infarcts on the prior MRI of the brain are not appreciable on CT. 3. Moderate chronic microvascular ischemic changes and moderate parenchymal volume loss of the brain. 4. Complete occlusion of right internal carotid artery to the terminus and near complete occlusion of the left internal carotid artery with minimal patency of left paraclinoid and terminal segments. 5. Patent circle of Willis  without large vessel occlusion, aneurysm, or vascular malformation. 6. Diffusely diminished caliber of the anterior cerebral artery and middle cerebral artery distributions. 7. Moderate midbasilar and bilateral P1 segment foci of stenosis compatible with intracranial atherosclerosis. These results will be called to the ordering clinician or representative by the Radiologist Assistant, and communication documented in the PACS or zVision Dashboard. Electronically Signed   By: Mitzi Hansen M.D.   On: 06/27/2016 00:02   Ct Angio Neck W Or Wo Contrast  Result Date: 06/27/2016 CLINICAL DATA:  83 y/o F; history of stroke, TIA, and atrial fibrillation. EXAM: CT ANGIOGRAPHY HEAD AND NECK TECHNIQUE: Multidetector CT imaging of the head and neck was performed using the standard protocol during bolus administration of intravenous contrast. Multiplanar CT image reconstructions and MIPs were obtained to evaluate the vascular anatomy. Carotid stenosis measurements (when applicable) are obtained utilizing NASCET criteria, using the distal internal carotid diameter as the denominator. CONTRAST:  50 cc Isovue 370 COMPARISON:  06/25/2016 MRI of the brain. FINDINGS: CT HEAD FINDINGS Brain: Small right frontal lobe and right inferior cerebellar hemisphere chronic infarcts. Nonspecific left posterior temporal calcification, likely sequelae of prior infectious/inflammatory process. Moderate chronic microvascular ischemic changes of white matter and moderate brain parenchymal volume loss. Small infarcts on prior MRI are not appreciable on CT. No hydrocephalus, intracranial hemorrhage, or effacement of basilar cisterns. No evidence for new large territory infarct. Incidental subcentimeter anterior falx lipoma. Vascular: As below. Skull: Normal. Negative for fracture or focal lesion. Sinuses: Imaged portions are clear. Orbits: Bilateral intra-ocular lens replacement. Review of the MIP images confirms the above findings CTA  NECK FINDINGS Aortic arch: Aberrant right subclavian artery. Mild calcific atherosclerosis of the aortic arch. No evidence for dissection, vasculitis, or significant stenosis of great vessel origins. Right carotid system: Patent right common carotid artery. Complete occlusion of the right internal carotid artery two-view terminus. Left carotid system: Patent left common carotid artery. Near complete occlusion of the left internal carotid artery in the neck with intermittent thread-like opacity. Vertebral arteries: Left dominant. Dense calcification of right vertebral artery origin with probable stenosis, accurate assessment is limited due to the dense calcification. Widely patent left vertebral artery. Skeleton: Cervical spondylosis with moderate bilateral facet degenerative changes most pronounced at the C3 through C5 levels and severe discogenic degenerative changes at C5 through C7. Uncovertebral hypertrophy results in foraminal narrowing greatest at the left C6-7 and right C5-6 levels. No high-grade bony canal stenosis. Other neck: Negative. Upper chest: Negative. Review of the MIP images confirms the above findings CTA HEAD FINDINGS Anterior circulation: Complete occlusion of the right internal carotid  artery to the terminus. The left internal carotid artery is occluded in the petrous and cavernous segments with minimal patency of paraclinoid and terminal segments. Bilateral anterior cerebral artery and middle cerebral artery distributions are patent although the vessels are diffusely diminished in caliber. Posterior circulation: Mild irregularity of the basilar artery with short segment of mid stenosis of the mid segment. Mild bilateral P1 segments of stenosis. Otherwise no evidence for high-grade stenosis, aneurysm, vascular malformation, or proximal occlusion. Venous sinuses: As permitted by contrast timing, patent. Anatomic variants: Small anterior communicating artery. No posterior communicating artery  identified, likely hypoplastic or absent. Delayed phase: No abnormal intracranial enhancement. Review of the MIP images confirms the above findings IMPRESSION: 1. Small right frontal and right inferior cerebellar hemisphere chronic infarcts. 2. Small acute infarcts on the prior MRI of the brain are not appreciable on CT. 3. Moderate chronic microvascular ischemic changes and moderate parenchymal volume loss of the brain. 4. Complete occlusion of right internal carotid artery to the terminus and near complete occlusion of the left internal carotid artery with minimal patency of left paraclinoid and terminal segments. 5. Patent circle of Willis without large vessel occlusion, aneurysm, or vascular malformation. 6. Diffusely diminished caliber of the anterior cerebral artery and middle cerebral artery distributions. 7. Moderate midbasilar and bilateral P1 segment foci of stenosis compatible with intracranial atherosclerosis. These results will be called to the ordering clinician or representative by the Radiologist Assistant, and communication documented in the PACS or zVision Dashboard. Electronically Signed   By: Mitzi Hansen M.D.   On: 06/27/2016 00:02        Scheduled Meds: . apixaban  5 mg Oral BID  . atorvastatin  20 mg Oral q1800  . chlorhexidine  15 mL Mouth Rinse BID  . citalopram  20 mg Oral q morning - 10a  . insulin aspart  0-9 Units Subcutaneous TID WC  . lacosamide (VIMPAT) IV  100 mg Intravenous Q12H  . metoprolol tartrate  25 mg Oral BID  . multivitamin with minerals  1 tablet Oral Daily  . pantoprazole (PROTONIX) IV  40 mg Intravenous Q24H  . QUEtiapine  25 mg Oral QHS  . sodium chloride flush  3 mL Intravenous Q12H   Continuous Infusions: . sodium chloride 10 mL/hr at 06/25/16 2319     LOS: 2 days    Time spent: 35 minutes    Toyna Erisman A, MD [facility] Pager [phone]  If 7PM-7AM, please contact night-coverage [email or web address] Password  [facility] 06/28/2016, 11:55 AM

## 2016-06-28 NOTE — Progress Notes (Signed)
  Speech Language Pathology Treatment: Dysphagia  Patient Details Name: Heidi Miranda MRN: [id number] DOB: 30-Jul-1932 Today's Date: 06/28/2016 Time: 4696-2952 SLP Time Calculation (min) (ACUTE ONLY): 15 min  Assessment / Plan / Recommendation Clinical Impression  Patient seen for dysphagia treatment. Pt is alert, responds verbally to questions, oriented to self only. Follows basic commands. Per RN pt continues to have intermittent lethargy. Retrieves ice chip from spoon with min cues for mouth opening. Reduced bolus retrieval of liquids from cup, spoon, with anterior oral spillage and wet vocal quality. Tolerated teaspoons nectar-thick liquid and pureed solids with adequate oral manipulation, appearance of timely swallow and adequate airway protection. Recommend initiating dys 1 diet with nectar-thick liquids via teaspoon, medications crushed in yogurt or pudding (pt dislikes applesauce). SLP will f/u for tolerance and advancement as appropriate.    HPI HPI: 83 y.o.femalewith PMH of HTN, HLD, DM, Afib on eliquis, OSA, dementia followed with Dr. Lucia Gaskins at [facility] presented for code stroke. Obtained further information from EMS and husband. Apparently pt had multiple seizure episodes with falls at home for the last one week, and today found to have right facial droop and right UE weakness after a GTC episode. She again had another GTC seizure on CT table. MRI shows Small subcentimeter areas of restricted diffusion in the left frontal cortex over the convexity most compatible with acute infarct.      SLP Plan  Continue with current plan of care       Recommendations  Diet recommendations: Dysphagia 1 (puree);Nectar-thick liquid Liquids provided via: Teaspoon Medication Administration: Crushed with puree Supervision: Full supervision/cueing for compensatory strategies;Staff to assist with self feeding Compensations: Slow rate;Small sips/bites;Minimize environmental distractions Postural Changes  and/or Swallow Maneuvers: Seated upright 90 degrees                Oral Care Recommendations: Oral care BID Follow up Recommendations: Skilled Nursing facility SLP Visit Diagnosis: Dysphagia, unspecified (R13.10) Plan: Continue with current plan of care       GO               Rondel Baton, MS CF-SLP Speech-Language Pathologist [phone]  Arlana Lindau 06/28/2016, 4:51 PM

## 2016-06-29 LAB — GLUCOSE, CAPILLARY
GLUCOSE-CAPILLARY: 95 mg/dL (ref 65–99)
GLUCOSE-CAPILLARY: 104 mg/dL — AB (ref 65–99)
GLUCOSE-CAPILLARY: 120 mg/dL — AB (ref 65–99)
Glucose-Capillary: 94 mg/dL (ref 65–99)
Glucose-Capillary: 98 mg/dL (ref 65–99)

## 2016-06-29 LAB — BASIC METABOLIC PANEL
Anion gap: 8 (ref 5–15)
BUN: 12 mg/dL (ref 6–20)
CO2: 25 mmol/L (ref 22–32)
CREATININE: 0.94 mg/dL (ref 0.44–1.00)
Calcium: 8.5 mg/dL — ABNORMAL LOW (ref 8.9–10.3)
Chloride: 113 mmol/L — ABNORMAL HIGH (ref 101–111)
GFR calc non Af Amer: 55 mL/min — ABNORMAL LOW (ref >60)
Glucose, Bld: 89 mg/dL (ref 65–99)
Potassium: 3.6 mmol/L (ref 3.5–5.1)
SODIUM: 146 mmol/L — AB (ref 135–145)

## 2016-06-29 LAB — CBC
HCT: 37.5 % (ref 36.0–46.0)
Hemoglobin: 12.3 g/dL (ref 12.0–15.0)
MCH: 30.7 pg (ref 26.0–34.0)
MCHC: 32.8 g/dL (ref 30.0–36.0)
MCV: 93.5 fL (ref 78.0–100.0)
PLATELETS: 111 10*3/uL — AB (ref 150–400)
RBC: 4.01 MIL/uL (ref 3.87–5.11)
RDW: 13.5 % (ref 11.5–15.5)
WBC: 6 10*3/uL (ref 4.0–10.5)

## 2016-06-29 NOTE — Progress Notes (Signed)
PROGRESS NOTE  Heidi Miranda  ZOX:[id number] DOB: [birth year] DOA: 06/25/2016 PCP: Sissy Hoff, MD Outpatient Specialists:  Subjective: More awake and alert, answers simple questions about her name and date of birth. Somnolent, not sure why she still sleepy,? Vimpat, neurology please advise.  Brief Narrative:  Heidi Miranda is a 83 y.o. female with medical history significant of HTN, A. fib on Eliquis and history of stroke (finding on the CT scan) she was brought to the hospital because of tonic-clonic seizures. Of note patient came into the hospital 2 days ago because of falls and upon further questioning husband mentioned she was having shakes. Initially patient came in as a code stroke, apparently earlier today she was eating her lunch with her husband and she dropped her for consult start shaking, and after that she had full blown seizures. Last about 5 minutes. She had another episode in the ED. Per EMS she had right facial droop was not seen in the ED here.  Assessment & Plan:   Principal Problem:   Seizure (HCC) Active Problems:   Atrial fibrillation (HCC)   HTN (hypertension)   Cognitive decline   H/O: CVA (cerebrovascular accident)   Dementia without behavioral disturbance   Weakness generalized   Acute CVA (cerebrovascular accident) (HCC)   Bilateral carotid artery occlusion   Probable acute CVA -Patient presented with new onset seizure and right facial droop (was not seen in the ED). -MRI although degraded by motion showed areas of restricted diffusion in the left frontal cortex compatible with acute infarct. -She has atrial fibrillation, she is on Eliquis, per daughter not sure if she takes all her medications. -Neurology recommended to add low-dose aspirin to Eliquis.  Seizures -New onset seizure, generalized seizures, with postictal state. -Nephrology consulted, risk factors including dementia and history of stroke. -Recent CT (done on 3/26) showed old  infarct. -Given Ativan, fosphenytoin, continue no Vimpat, patient is somnolent, not sure this is secondary to Vimpat.  Dysphagia -Seen by SLP and recommended nothing by mouth. -SLP recommended dysphagia 1 with nectar thick liquids, continue to follow.  Atrial fibrillation -Rate is controlled, heart rate mainly in the 80s with one bout of 100 teens. -CHA2DS2-VASc is at least 6, female, 2+ age, 2+ stroke and HTN. -She is on Eliquis, continued.  Dementia without behavioral disturbances -Spoke with her daughter at bedside, she describes classic short-term memory loss consistent with dementia. -This is been worsening in the past 1 year.  Hypertension -Blood pressure control, continue home medications.  Generalized weakness -Recent UTI, treated with ciprofloxacin, urine culture showed no growth to date, stop antibiotics, recheck UA and cultures.  OSA -Patient tried CPAP before was not able to use it, will hold on using it for now.   DVT prophylaxis: Subcutaneous heparin Code Status: DNR Family Communication:  Disposition Plan:  Diet: DIET - DYS 1 Room service appropriate? Yes; Fluid consistency: Nectar Thick  Consultants:   Neuro  Procedures:   EEG consistent with nonspecific mild generalized cerebral dysfunction  Antimicrobials:   None  Objective: Vitals:   06/28/16 2053 06/29/16 0058 06/29/16 0537 06/29/16 1050  BP: (!) 143/89 (!) 104/54 (!) 135/49 (!) 145/51  Pulse: 91 65 60 74  Resp: Temp: 98.5 F (36.9 C) 98.4 F (36.9 C) 98.7 F (37.1 C) 98.1 F (36.7 C)  TempSrc: Axillary Axillary Oral Oral  SpO2: 98% 99% 98% 99%  Weight:      Height:        Intake/Output  Summary (Last 24 hours) at 06/29/16 1247 Last data filed at 06/29/16 1012  Gross per 24 hour  Intake              275 ml  Output                0 ml  Net              275 ml   Filed Weights   06/25/16 1300 06/27/16 2100  Weight: 81.6 kg (179 lb 14.3 oz) 78.3 kg (172 lb 9.9 oz)     Examination: General exam: Appears calm and comfortable  Respiratory system: Clear to auscultation. Respiratory effort normal. Cardiovascular system: S1 & S2 heard, RRR. No JVD, murmurs, rubs, gallops or clicks. No pedal edema. Gastrointestinal system: Abdomen is nondistended, soft and nontender. No organomegaly or masses felt. Normal bowel sounds heard. Central nervous system: Alert and oriented. No focal neurological deficits. Extremities: Symmetric 5 x 5 power. Skin: No rashes, lesions or ulcers Psychiatry: Judgement and insight appear normal. Mood & affect appropriate.   Data Reviewed: I have personally reviewed following labs and imaging studies  CBC:  Recent Labs Lab 06/25/16 1335 06/25/16 1341 06/26/16 0329 06/27/16 0315 06/28/16 0513 06/29/16 0258  WBC 8.0  --  6.6 12.0* 8.2 6.0  NEUTROABS 6.0  --   --   --   --   --   HGB 13.3 12.6 12.7 14.6 13.2 12.3  HCT 39.6 37.0 38.2 44.0 39.5 37.5  MCV 91.9  --  92.0 91.1 91.9 93.5  PLT 140*  --  130* 176 123* 111*   Basic Metabolic Panel:  Recent Labs Lab 06/25/16 1335 06/25/16 1341 06/26/16 0329 06/27/16 0315 06/28/16 0513 06/29/16 0258  NA 141 144 145 144 145 146*  K 3.9 3.9 3.5 4.0 3.9 3.6  CL 109 106 109 109 110 113*  CO2 23  --  GLUCOSE 122* 121* 104* 147* 102* 89  BUN CREATININE 1.20* 1.20* 0.85 1.01* 0.97 0.94  CALCIUM 8.8*  --  8.7* 9.3 8.8* 8.5*   GFR: Estimated Creatinine Clearance: 49.6 mL/min (by C-G formula based on SCr of 0.94 mg/dL). Liver Function Tests:  Recent Labs Lab 06/25/16 1335  AST 24  ALT 18  ALKPHOS 90  BILITOT 1.0  PROT 6.0*  ALBUMIN 3.5   No results for input(s): LIPASE, AMYLASE in the last 168 hours. No results for input(s): AMMONIA in the last 168 hours. Coagulation Profile:  Recent Labs Lab 06/25/16 1335 06/25/16 2041  INR 1.44 1.34   Cardiac Enzymes: No results for input(s): CKTOTAL, CKMB, CKMBINDEX, TROPONINI in the last 168  hours. BNP (last 3 results) No results for input(s): PROBNP in the last 8760 hours. HbA1C: No results for input(s): HGBA1C in the last 72 hours. CBG:  Recent Labs Lab 06/28/16 0609 06/28/16 1202 06/29/16 0547 06/29/16 0755 06/29/16 1241  GLUCAP 114* 101* 95 104* 120*   Lipid Profile: No results for input(s): CHOL, HDL, LDLCALC, TRIG, CHOLHDL, LDLDIRECT in the last 72 hours. Thyroid Function Tests: No results for input(s): TSH, T4TOTAL, FREET4, T3FREE, THYROIDAB in the last 72 hours. Anemia Panel: No results for input(s): VITAMINB12, FOLATE, FERRITIN, TIBC, IRON, RETICCTPCT in the last 72 hours. Urine analysis:    Component Value Date/Time   COLORURINE YELLOW 06/25/2016 1710   APPEARANCEUR CLEAR 06/25/2016 1710   LABSPEC 1.023 06/25/2016 1710   PHURINE 5.0 06/25/2016 1710   GLUCOSEU NEGATIVE  06/25/2016 1710   HGBUR NEGATIVE 06/25/2016 1710   BILIRUBINUR NEGATIVE 06/25/2016 1710   KETONESUR NEGATIVE 06/25/2016 1710   PROTEINUR NEGATIVE 06/25/2016 1710   UROBILINOGEN 1.0 03/28/2012 1119   NITRITE NEGATIVE 06/25/2016 1710   LEUKOCYTESUR NEGATIVE 06/25/2016 1710   Sepsis Labs: (procalcitonin:4,lacticidven:4)  ) Recent Results (from the past 240 hour(s))  Urine culture     Status: Abnormal   Collection Time: 06/23/16  9:08 AM  Result Value Ref Range Status   Specimen Description URINE, CLEAN CATCH  Final   Special Requests NONE  Final   Culture MULTIPLE SPECIES PRESENT, SUGGEST RECOLLECTION (A)  Final   Report Status 06/24/2016 FINAL  Final  MRSA PCR Screening     Status: None   Collection Time: 06/25/16  8:28 PM  Result Value Ref Range Status   MRSA by PCR NEGATIVE NEGATIVE Final    Comment:        The GeneXpert MRSA Assay (FDA approved for NASAL specimens only), is one component of a comprehensive MRSA colonization surveillance program. It is not intended to diagnose MRSA infection nor to guide or monitor treatment for MRSA infections.       Invalid input(s): PROCALCITONIN, LACTICACIDVEN   Radiology Studies: No results found.      Scheduled Meds: . apixaban  5 mg Oral BID  . atorvastatin  20 mg Oral q1800  . chlorhexidine  15 mL Mouth Rinse BID  . citalopram  20 mg Oral q morning - 10a  . insulin aspart  0-9 Units Subcutaneous TID WC  . lacosamide (VIMPAT) IV  100 mg Intravenous Q12H  . metoprolol tartrate  25 mg Oral BID  . multivitamin with minerals  1 tablet Oral Daily  . pantoprazole (PROTONIX) IV  40 mg Intravenous Q24H  . QUEtiapine  25 mg Oral QHS  . sodium chloride flush  3 mL Intravenous Q12H   Continuous Infusions: . sodium chloride 50 mL/hr at 06/29/16 0943     LOS: 3 days    Time spent: 35 minutes    Fiana Gladu A, MD [facility] Pager [phone]  If 7PM-7AM, please contact night-coverage [email or web address] Password [facility] 06/29/2016, 12:47 PM

## 2016-06-30 LAB — CBC
HCT: 35.8 % — ABNORMAL LOW (ref 36.0–46.0)
Hemoglobin: 11.8 g/dL — ABNORMAL LOW (ref 12.0–15.0)
MCH: 30.5 pg (ref 26.0–34.0)
MCHC: 33 g/dL (ref 30.0–36.0)
MCV: 92.5 fL (ref 78.0–100.0)
Platelets: 116 K/uL — ABNORMAL LOW (ref 150–400)
RBC: 3.87 MIL/uL (ref 3.87–5.11)
RDW: 13.5 % (ref 11.5–15.5)
WBC: 6.1 K/uL (ref 4.0–10.5)

## 2016-06-30 LAB — BASIC METABOLIC PANEL WITH GFR
Anion gap: 6 (ref 5–15)
BUN: 9 mg/dL (ref 6–20)
CO2: 25 mmol/L (ref 22–32)
Calcium: 8.5 mg/dL — ABNORMAL LOW (ref 8.9–10.3)
Chloride: 113 mmol/L — ABNORMAL HIGH (ref 101–111)
Creatinine, Ser: 0.82 mg/dL (ref 0.44–1.00)
GFR calc Af Amer: >60 mL/min
GFR calc non Af Amer: >60 mL/min
Glucose, Bld: 112 mg/dL — ABNORMAL HIGH (ref 65–99)
Potassium: 3.3 mmol/L — ABNORMAL LOW (ref 3.5–5.1)
Sodium: 144 mmol/L (ref 135–145)

## 2016-06-30 LAB — GLUCOSE, CAPILLARY
Glucose-Capillary: 111 mg/dL — ABNORMAL HIGH (ref 65–99)
Glucose-Capillary: 94 mg/dL (ref 65–99)

## 2016-06-30 MED ORDER — POTASSIUM CHLORIDE CRYS ER 20 MEQ PO TBCR
60.0000 meq | EXTENDED_RELEASE_TABLET | Freq: Once | ORAL | Status: AC
  Administered 2016-06-30: 60 meq via ORAL
  Filled 2016-06-30: qty 3

## 2016-06-30 MED ORDER — PANTOPRAZOLE SODIUM 40 MG PO TBEC: 40.0000 mg | DELAYED_RELEASE_TABLET | Freq: Every day | ORAL | Status: DC

## 2016-06-30 MED ORDER — LACOSAMIDE 100 MG PO TABS: 100.0000 mg | ORAL_TABLET | Freq: Two times a day (BID) | ORAL | 0 refills | Status: DC

## 2016-06-30 MED ORDER — ASPIRIN EC 81 MG PO TBEC: 81.0000 mg | DELAYED_RELEASE_TABLET | Freq: Every day | ORAL | Status: DC

## 2016-06-30 NOTE — Progress Notes (Signed)
PROGRESS NOTE  Heidi Miranda  ZOX:[id number] DOB: 1933-02-08 DOA: 06/25/2016 PCP: Sissy Hoff, MD Outpatient Specialists:  Subjective: Feels okay, more awake and alert and answers simple questions was still short extension span. Look for SNF, discharge when bed available.  Brief Narrative:  Heidi Miranda is a 83 y.o. female with medical history significant of HTN, A. fib on Eliquis and history of stroke (finding on the CT scan) she was brought to the hospital because of tonic-clonic seizures. Of note patient came into the hospital 2 days ago because of falls and upon further questioning husband mentioned she was having shakes. Initially patient came in as a code stroke, apparently earlier today she was eating her lunch with her husband and she dropped her for consult start shaking, and after that she had full blown seizures. Last about 5 minutes. She had another episode in the ED. Per EMS she had right facial droop was not seen in the ED here.  Assessment & Plan:   Principal Problem:   Seizure (HCC) Active Problems:   Atrial fibrillation (HCC)   HTN (hypertension)   Cognitive decline   H/O: CVA (cerebrovascular accident)   Dementia without behavioral disturbance   Weakness generalized   Acute CVA (cerebrovascular accident) (HCC)   Bilateral carotid artery occlusion   Probable acute CVA -Patient presented with new onset seizure and right facial droop (was not seen in the ED). -MRI although degraded by motion showed areas of restricted diffusion in the left frontal cortex compatible with acute infarct. -She has atrial fibrillation, she is on Eliquis, per daughter not sure if she takes all her medications. -Neurology recommended to add low-dose aspirin to Eliquis.  Seizures -New onset seizure, generalized seizures, with postictal state. -Nephrology consulted, risk factors including dementia and history of stroke. -Recent CT (done on 3/26) showed old infarct. -Continue Vimpat  for seizures per recommendation.  Dysphagia -Seen by SLP and recommended nothing by mouth. -SLP recommended dysphagia 1 with nectar thick liquids, continue to follow.  Atrial fibrillation -Rate is controlled, heart rate mainly in the 80s with one bout of 100 teens. -CHA2DS2-VASc is at least 6, female, 2+ age, 2+ stroke and HTN. -She is on Eliquis, continued.  Dementia without behavioral disturbances -Spoke with her daughter at bedside, she describes classic short-term memory loss consistent with dementia. -This is been worsening in the past 1 year.  Hypertension -Blood pressure control, continue home medications.  Generalized weakness -Recent UTI, treated with ciprofloxacin, urine culture showed no growth to date, stop antibiotics, recheck UA and cultures.  OSA -Patient tried CPAP before was not able to use it, will hold on using it for now.   DVT prophylaxis: Subcutaneous heparin Code Status: DNR Family Communication:  Disposition Plan:  Diet: DIET - DYS 1 Room service appropriate? Yes; Fluid consistency: Nectar Thick Diet - low sodium heart healthy  Consultants:   Neuro  Procedures:   EEG consistent with nonspecific mild generalized cerebral dysfunction  Antimicrobials:   None  Objective: Vitals:   06/29/16 2105 06/30/16 0104 06/30/16 0500 06/30/16 0920  BP: (!) 141/72 123/75 (!) 142/71 (!) 148/70  Pulse: 90 77 77 67  Resp: Temp: 98.9 F (37.2 C) 98.2 F (36.8 C) 97.5 F (36.4 C) 98.5 F (36.9 C)  TempSrc: Axillary Axillary Oral Axillary  SpO2: 98% 99% 98% 100%  Weight:      Height:        Intake/Output Summary (Last 24 hours) at 06/30/16 1247 Last  data filed at 06/30/16 1206  Gross per 24 hour  Intake              985 ml  Output                0 ml  Net              985 ml   Filed Weights   06/25/16 1300 06/27/16 2100  Weight: 81.6 kg (179 lb 14.3 oz) 78.3 kg (172 lb 9.9 oz)    Examination: General exam: Appears calm and  comfortable  Respiratory system: Clear to auscultation. Respiratory effort normal. Cardiovascular system: S1 & S2 heard, RRR. No JVD, murmurs, rubs, gallops or clicks. No pedal edema. Gastrointestinal system: Abdomen is nondistended, soft and nontender. No organomegaly or masses felt. Normal bowel sounds heard. Central nervous system: Alert and oriented. No focal neurological deficits. Extremities: Symmetric 5 x 5 power. Skin: No rashes, lesions or ulcers Psychiatry: Judgement and insight appear normal. Mood & affect appropriate.   Data Reviewed: I have personally reviewed following labs and imaging studies  CBC:  Recent Labs Lab 06/25/16 1335  06/26/16 0329 06/27/16 0315 06/28/16 0513 06/29/16 0258 06/30/16 0628  WBC 8.0  --  6.6 12.0* 8.2 6.0 6.1  NEUTROABS 6.0  --   --   --   --   --   --   HGB 13.3  < > 12.7 14.6 13.2 12.3 11.8*  HCT 39.6  < > 38.2 44.0 39.5 37.5 35.8*  MCV 91.9  --  92.0 91.1 91.9 93.5 92.5  PLT 140*  --  130* 176 123* 111* 116*  < > = values in this interval not displayed. Basic Metabolic Panel:  Recent Labs Lab 06/26/16 0329 06/27/16 0315 06/28/16 0513 06/29/16 0258 06/30/16 0628  NA 145 144 145 146* 144  K 3.5 4.0 3.9 3.6 3.3*  CL 109 109 110 113* 113*  CO2 GLUCOSE 104* 147* 102* 89 112*  BUN CREATININE 0.85 1.01* 0.97 0.94 0.82  CALCIUM 8.7* 9.3 8.8* 8.5* 8.5*   GFR: Estimated Creatinine Clearance: 56.8 mL/min (by C-G formula based on SCr of 0.82 mg/dL). Liver Function Tests:  Recent Labs Lab 06/25/16 1335  AST 24  ALT 18  ALKPHOS 90  BILITOT 1.0  PROT 6.0*  ALBUMIN 3.5   No results for input(s): LIPASE, AMYLASE in the last 168 hours. No results for input(s): AMMONIA in the last 168 hours. Coagulation Profile:  Recent Labs Lab 06/25/16 1335 06/25/16 2041  INR 1.44 1.34   Cardiac Enzymes: No results for input(s): CKTOTAL, CKMB, CKMBINDEX, TROPONINI in the last 168 hours. BNP (last 3  results) No results for input(s): PROBNP in the last 8760 hours. HbA1C: No results for input(s): HGBA1C in the last 72 hours. CBG:  Recent Labs Lab 06/29/16 1241 06/29/16 1609 06/29/16 2207 06/30/16 0628 06/30/16 1128  GLUCAP 120* 94 98 111* 94   Lipid Profile: No results for input(s): CHOL, HDL, LDLCALC, TRIG, CHOLHDL, LDLDIRECT in the last 72 hours. Thyroid Function Tests: No results for input(s): TSH, T4TOTAL, FREET4, T3FREE, THYROIDAB in the last 72 hours. Anemia Panel: No results for input(s): VITAMINB12, FOLATE, FERRITIN, TIBC, IRON, RETICCTPCT in the last 72 hours. Urine analysis:    Component Value Date/Time   COLORURINE YELLOW 06/25/2016 1710   APPEARANCEUR CLEAR 06/25/2016 1710   LABSPEC 1.023 06/25/2016 1710   PHURINE 5.0 06/25/2016 1710   GLUCOSEU NEGATIVE 06/25/2016  1710   HGBUR NEGATIVE 06/25/2016 1710   BILIRUBINUR NEGATIVE 06/25/2016 1710   KETONESUR NEGATIVE 06/25/2016 1710   PROTEINUR NEGATIVE 06/25/2016 1710   UROBILINOGEN 1.0 03/28/2012 1119   NITRITE NEGATIVE 06/25/2016 1710   LEUKOCYTESUR NEGATIVE 06/25/2016 1710   Sepsis Labs: (procalcitonin:4,lacticidven:4)  ) Recent Results (from the past 240 hour(s))  Urine culture     Status: Abnormal   Collection Time: 06/23/16  9:08 AM  Result Value Ref Range Status   Specimen Description URINE, CLEAN CATCH  Final   Special Requests NONE  Final   Culture MULTIPLE SPECIES PRESENT, SUGGEST RECOLLECTION (A)  Final   Report Status 06/24/2016 FINAL  Final  MRSA PCR Screening     Status: None   Collection Time: 06/25/16  8:28 PM  Result Value Ref Range Status   MRSA by PCR NEGATIVE NEGATIVE Final    Comment:        The GeneXpert MRSA Assay (FDA approved for NASAL specimens only), is one component of a comprehensive MRSA colonization surveillance program. It is not intended to diagnose MRSA infection nor to guide or monitor treatment for MRSA infections.      Invalid input(s):  PROCALCITONIN, LACTICACIDVEN   Radiology Studies: No results found.      Scheduled Meds: . apixaban  5 mg Oral BID  . atorvastatin  20 mg Oral q1800  . chlorhexidine  15 mL Mouth Rinse BID  . citalopram  20 mg Oral q morning - 10a  . insulin aspart  0-9 Units Subcutaneous TID WC  . lacosamide (VIMPAT) IV  100 mg Intravenous Q12H  . metoprolol tartrate  25 mg Oral BID  . multivitamin with minerals  1 tablet Oral Daily  . [START ON 07/01/2016] pantoprazole  40 mg Oral Daily  . QUEtiapine  25 mg Oral QHS  . sodium chloride flush  3 mL Intravenous Q12H   Continuous Infusions: . sodium chloride 50 mL/hr at 06/30/16 0454     LOS: 4 days    Time spent: 35 minutes    Zechariah Bissonnette A, MD [facility] Pager [phone]  If 7PM-7AM, please contact night-coverage [email or web address] Password [facility] 06/30/2016, 12:47 PM

## 2016-06-30 NOTE — Care Management Note (Signed)
Case Management Note  Patient Details  Name: Heidi Miranda MRN: [id number] Date of Birth: 1932/12/16  Subjective/Objective:    From home with spouse, presents with seizures, ct (-), per neuro-,Small subcentimeter areas of restricted diffusion in the left frontal cortex over the convexity most compatible with acute infarct. 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 Left cavernous carotid could be occluded but is difficult to evaluate due to motion., failed bedside swallow, speech seeing patient. conts on vimpat iv, Per pt/ot eval rec SNF, CSW referral.                 Action/Plan:   Expected Discharge Date:                  Expected Discharge Plan:  Skilled Nursing Facility  In-House Referral:  Clinical Social Work  Discharge planning Services  CM Consult  Post Acute Care Choice:    Choice offered to:     DME Arranged:    DME Agency:     HH Arranged:    HH Agency:     Status of Service:  Completed, signed off  If discussed at [employer], dates discussed:    Additional Comments: 06/30/2016 Pt is stable for discharge to SNF today.  CM notified CSW Artelia Laroche 06/30/2016, 10:02 AM

## 2016-06-30 NOTE — Clinical Social Work Placement (Signed)
   CLINICAL SOCIAL WORK PLACEMENT  NOTE  Date:  06/30/2016  Patient Details  Name: Heidi Miranda MRN: [id number] Date of Birth: 1932-06-01  Clinical Social Work is seeking post-discharge placement for this patient at the Skilled  Nursing Facility level of care (*CSW will initial, date and re-position this form in  chart as items are completed):  Yes   Patient/family provided with [facility] Clinical Social Work Department's list of facilities offering this level of care within the geographic area requested by the patient (or if unable, by the patient's family).  Yes   Patient/family informed of their freedom to choose among providers that offer the needed level of care, that participate in Medicare, Medicaid or managed care program needed by the patient, have an available bed and are willing to accept the patient.  Yes   Patient/family informed of [facility]'s ownership interest in [facility] and [facility], as well as of the fact that they are under no obligation to receive care at these facilities.  PASRR submitted to EDS on       PASRR number received on       Existing PASRR number confirmed on 06/30/16     FL2 transmitted to all facilities in geographic area requested by pt/family on 06/30/16     FL2 transmitted to all facilities within larger geographic area on       Patient informed that his/her managed care company has contracts with or will negotiate with certain facilities, including the following:        Yes   Patient/family informed of bed offers received.  Patient chooses bed at [facility] [place]     Physician recommends and patient chooses bed at      Patient to be transferred to [facility] [place] on 06/30/16.  Patient to be transferred to facility by [facility]     Patient family notified on 06/30/16 of transfer.  Name of family member notified:  Zara Chess     PHYSICIAN       Additional Comment:     _______________________________________________ Dominic Pea, LCSW 06/30/2016, 4:55 PM

## 2016-06-30 NOTE — Care Management Important Message (Signed)
Important Message  Patient Details  Name: Heidi Miranda MRN: [id number] Date of Birth: [birth year]   Medicare Important Message Given:  Yes    Lessie Funderburke Stefan Church 06/30/2016, 4:41 PM

## 2016-06-30 NOTE — Clinical Social Work Note (Addendum)
Clinical Social Work Assessment  Patient Details  Name: Heidi Miranda MRN: [id number] Date of Birth: [birth year]  Date of referral:  06/30/16               Reason for consult:  Discharge Planning                Permission sought to share information with:  Family Supports, Customer service manager Permission granted to share information::  Yes, Verbal Permission Granted  Name::     Heidi Miranda  Agency::  SNFs  Relationship::  Daughter  Contact Information:  [phone]  Housing/Transportation Living arrangements for the past 2 months:  [place] of Information:  Patient, Adult Children Patient Interpreter Needed:  None Criminal Activity/Legal Involvement Pertinent to Current Situation/Hospitalization:  No - Comment as needed Significant Relationships:  Adult Children, Spouse Lives with:  Spouse Do you feel safe going back to the place where you live?  No Need for family participation in patient care:  Yes (Comment)  Care giving concerns:  Spouse unable to care for pt due to pt needing increased level of care.    Social Worker assessment / plan:  CSW met with pt to address consult for new SNF. Pt is only oriented to self and appeared to be somewhat confused. Pt agreeable to SNF for STR. CSW confirmed permission to speak with spouse.   CSW called spouse-Heidi Miranda [phone]). Spouse asked that CSW contact dtr for all decisions.   CSW called daughterMelvenia Miranda [phone]). CSW introduced herself and explained role of social work. [name] is recommending STR at SNF. CSW explained discharging to SNF with Medicare. CSW provided SNF listing for review. Dtr confirmed d/c plan to SNF and prefers [facility]. Dtr confirmed pt is form home with spouse, but had several falls in the last week. Dtr states spouse agrees he can no longer care for pt.    CSW sent FL-2 to SNFs and received confirmation from [facility].  Pt is ready for discharge today and will go to  [facility]. Pt and dtr are aware and agreeable to discharge plan. CSW sent clinicals/dc summary to [facility] and communicated with [facility] (admissions) for room and report. Room and report provided to RN and put in treatment team sticky note. Transportation arranged with [facility]. Signed DNR on chart. CSW is signing off as no further needs identified.   Employment status:  Retired Forensic scientist:  Medicare PT Recommendations:  [place] / Referral to community resources:  [place]  Patient/Family's Response to care:  Daughter was very Patent attorney of CSW guidance and support.   Patient/Family's Understanding of and Emotional Response to Diagnosis, Current Treatment, and Prognosis:  Daughter is very supportive and understands pt's diagnosis and prognosis.   Emotional Assessment Appearance:  Appears stated age Attitude/Demeanor/Rapport:  Other (Appropriate) Affect (typically observed):  Accepting, Adaptable, Pleasant Orientation:  Oriented to Self Alcohol / Substance use:  Other Psych involvement (Current and /or in the community):  No (Comment)  Discharge Needs  Concerns to be addressed:  Care Coordination Readmission within the last 30 days:  No Current discharge risk:  Cognitively Impaired Barriers to Discharge:  Continued Medical Work up   [employer], LCSW 06/30/2016, 4:42 PM

## 2016-06-30 NOTE — Progress Notes (Signed)
Report called to [facility].  Patient to transport from [facility] via non-emergent [facility]. Heidi Miranda

## 2016-06-30 NOTE — Progress Notes (Signed)
qPhysical Therapy Treatment Patient Details Name: Heidi Miranda MRN: [id number] DOB: May 03, 1932 Today's Date: 06/30/2016    History of Present Illness Pt is an 83 y/o female admitted with tonic-clonic seizure activity. MRI + for acute L frontal CVA. PMH: HTN, dementia, afib on Eliquis, anxiety, depression, DM, OSA.    PT Comments    Pt progressing towards goals. Pt still exhibiting cognitive deficits and follows single step commands inconsistently. Able to perform stand pivot transfer this session with mod/max A +2. Required assist with LE movement secondary to difficulty with sequencing. Continue to recommend SNF at d/c to increase independence with functional mobility. Will continue to follow.   Follow Up Recommendations  SNF;Supervision/Assistance - 24 hour     Equipment Recommendations  None recommended by PT    Recommendations for Other Services       Precautions / Restrictions Precautions Precautions: Fall Restrictions Weight Bearing Restrictions: No    Mobility  Bed Mobility Overal bed mobility: Needs Assistance Bed Mobility: Supine to Sit     Supine to sit: Max assist;+2 for physical assistance     General bed mobility comments: Required tactile cues for intitiation, as she followed verbal cues inconsistently. Max A +2 for LE management and trunk elevation.   Transfers Overall transfer level: Needs assistance Equipment used: 2 person hand held assist Transfers: Sit to/from [employer] Sit to Stand: Mod assist;+2 physical assistance Stand pivot transfers: Mod assist;Max assist;+2 physical assistance       General transfer comment: Pt required tactile cues for initiation of standing. Mod A +2 for lift assist. For stand pivot pt requiring mod to max A +2 for steadying and for assist with LE movement. Pt refusing to use the RW stating she does not like the RW.   Ambulation/Gait                 Stairs            Wheelchair  Mobility    Modified Rankin (Stroke Patients Only)       Balance   Sitting-balance support: Feet supported;No upper extremity supported Sitting balance-Leahy Scale: Poor Sitting balance - Comments: Required from max to supervision to maintain sitting balance. Required continuous verbal cues for anterior weight shifting to maintain balance.  Postural control: Posterior lean Standing balance support: Bilateral upper extremity supported;During functional activity Standing balance-Leahy Scale: Poor Standing balance comment: mod-max A +2 for standing balance.                             Cognition Arousal/Alertness: Awake/alert (once sitting. In supine multiple cues for eye opening ) Behavior During Therapy: Flat affect Overall Cognitive Status: Impaired/Different from baseline Area of Impairment: Orientation;Attention;Following commands;Safety/judgement;Problem solving;Awareness                 Orientation Level: Disoriented to;Time;Situation Current Attention Level: Sustained   Following Commands: Follows one step commands inconsistently Safety/Judgement: Decreased awareness of safety;Decreased awareness of deficits Awareness: Emergent Problem Solving: Slow processing;Decreased initiation;Difficulty sequencing;Requires verbal cues;Requires tactile cues General Comments: Disoriented to time and situation. Required verbal cues for birthday. Pt required multiple cues throughout session for attention to task.      Exercises General Exercises - Lower Extremity Ankle Circles/Pumps: AAROM;Both;10 reps;Supine Heel Slides: AAROM;Both;10 reps;Supine    General Comments General comments (skin integrity, edema, etc.): Pt requiring max cues for reattention to task this session. Active assist for completion of supine ther ex secondary to  inconsistent command following.       Pertinent Vitals/Pain Pain Assessment: Faces Faces Pain Scale: No hurt    Home Living                       Prior Function            PT Goals (current goals can now be found in the care plan section) Acute Rehab PT Goals PT Goal Formulation: Patient unable to participate in goal setting Time For Goal Achievement: 07/10/16 Potential to Achieve Goals: Fair Progress towards PT goals: Progressing toward goals    Frequency    Min 2X/week      PT Plan Current plan remains appropriate    Co-evaluation             End of Session Equipment Utilized During Treatment: Gait belt;Oxygen Activity Tolerance: Patient tolerated treatment well Patient left: in chair;with call bell/phone within reach;with chair alarm set;Other (comment) (mits reapplied; discussed with NT prior to session) Nurse Communication: Mobility status PT Visit Diagnosis: Other abnormalities of gait and mobility (R26.89);Muscle weakness (generalized) (M62.81);Other symptoms and signs involving the nervous system (R29.898)     Time: 1610-9604 PT Time Calculation (min) (ACUTE ONLY): 16 min  Charges:  $Therapeutic Activity: 8-22 mins                    G Codes:       Margot Chimes, PT, DPT  Acute Rehabilitation Services  Pager: [phone]   Melvyn Novas 06/30/2016, 11:07 AM

## 2016-06-30 NOTE — Discharge Summary (Addendum)
Physician Discharge Summary  Heidi Miranda ZOX:[id number] DOB: [birth year] DOA: 06/25/2016  PCP: Sissy Hoff, MD  Admit date: 06/25/2016 Discharge date: 06/30/2016  Admitted From: Home Disposition: Home  Recommendations for Outpatient Follow-up:  1. Follow up with PCP in 1-2 weeks 2. Please obtain BMP/CBC in one week. 3. Added aspirin and Vimpat 100 mg twice a day.  Home Health: NA Equipment/Devices:NA  Discharge Condition: Stable CODE STATUS: DNR Diet recommendation: DIET - DYS 1; Fluid consistency: Nectar Thick, SLP to follow in the nursing home.  Brief/Interim Summary: Heidi Miranda a 82 y.o.femalewith medical history significant of HTN, A. fib on Eliquis and history of stroke (finding on the CT scan) she was brought to the hospital because of tonic-clonic seizures. Of note patient came into the hospital 2 days ago because of falls and upon further questioning husband mentioned she was having shakes. Initially patient came in as a code stroke, apparently earlier today she was eating her lunch with her husband and she dropped her for consult start shaking, and after that she had full blown seizures. Last about 5 minutes. She had another episode in the ED. Per EMS she had right facial droop was not seen in the ED here.  Discharge Diagnoses:  Principal Problem:   Seizure [facility]) Active Problems:   Atrial fibrillation (HCC)   HTN (hypertension)   Cognitive decline   H/O: CVA (cerebrovascular accident)   Dementia without behavioral disturbance   Weakness generalized   Acute CVA (cerebrovascular accident) (HCC)   Bilateral carotid artery occlusion   Probable acute CVA -Patient presented with new onset seizure and right facial droop (was not seen in the ED). -MRI although degraded by motion showed areas of restricted diffusion in the left frontal cortex compatible with acute infarct. -She has atrial fibrillation, she is on Eliquis, per daughter not sure if she takes all her  medications. -Per neurology recommendation discharge on Eliquis and low-dose aspirin.  Seizures -New onset seizure, generalized seizures, with postictal state. -Nephrology consulted, risk factors including dementia and history of stroke. -Recent CT (done on 3/26) showed old infarct. -Continue Vimpat for seizures per recommendation.  Dysphagia -SLP recommended dysphagia 1 with nectar thick liquids, recommend SLP to follow in the nursing home. -Encourage fluid intake as modified thickened fluid put people on risk of dehydration.  Atrial fibrillation, chronic -Rate is controlled, heart rate mainly in the 80s with one bout of 100 teens. -CHA2DS2-VASc is at least 6, female, 2+ age, 2+ stroke and HTN. -She is on Eliquis, continued.  Dementia without behavioral disturbances -Spoke with her daughter at bedside, she describesclassic short-term memory loss consistent with dementia. -This is been worsening in the past 1 year.  Hypertension -Blood pressure control, continue home medications.  Generalized weakness -Recent UTI, treated with ciprofloxacin, urine culture showed no growth to date, stop antibiotics, recheck UA and cultures.  OSA -Patient tried CPAP before was not able to use it, will hold on using it for now.  Hypokalemia -Potassium of 3.3 on discharge, repleted with oral supplements. Recommend check in 1 week.  Discharge Instructions  Discharge Instructions    Ambulatory referral to Neurology    Complete by:  As directed    Please follow up with Dr. Lucia Gaskins at [facility] in 6 weeks. Thanks.   Diet - low sodium heart healthy    Complete by:  As directed    Increase activity slowly    Complete by:  As directed      Allergies as of 06/30/2016  Reactions   Penicillins Hives   Has patient had a PCN reaction causing immediate rash, facial/tongue/throat swelling, SOB or lightheadedness with hypotension: Yes Has patient had a PCN reaction causing severe rash involving  mucus membranes or skin necrosis: No Has patient had a PCN reaction that required hospitalization: No Has patient had a PCN reaction occurring within the last 10 years: No If all of the above answers are "NO", then may proceed with Cephalosporin use.      Medication List    STOP taking these medications   ciprofloxacin 500 MG tablet Commonly known as:  CIPRO   furosemide 20 MG tablet Commonly known as:  LASIX     TAKE these medications   acetaminophen 325 MG tablet Commonly known as:  TYLENOL Take 650 mg by mouth every 6 (six) hours as needed for mild pain or headache.   apixaban 5 MG Tabs tablet Commonly known as:  ELIQUIS Take 1 tablet (5 mg total) by mouth 2 (two) times daily.   aspirin EC 81 MG tablet Take 1 tablet (81 mg total) by mouth daily.   atorvastatin 20 MG tablet Commonly known as:  LIPITOR Take 1 tablet (20 mg total) by mouth every morning.   CALCIUM PO Take 1 tablet by mouth daily.   citalopram 20 MG tablet Commonly known as:  CELEXA Take 20 mg by mouth every morning.   Lacosamide 100 MG Tabs Commonly known as:  VIMPAT Take 1 tablet (100 mg total) by mouth 2 (two) times daily.   metoprolol succinate 25 MG 24 hr tablet Commonly known as:  TOPROL-XL Take 3 tablets (75 mg total) by mouth daily. What changed:  how much to take  when to take this  additional instructions   multivitamin with minerals Tabs tablet Take 1 tablet by mouth daily.   PREVACID 24HR 15 MG capsule Generic drug:  lansoprazole Take 15 mg by mouth daily as needed (for reflux/GERD).      Follow-up Information    Anson Fret, MD. Schedule an appointment as soon as possible for a visit in 6 week(s).   Specialty:  Neurology Contact information: [address] [place] [address] [phone]          Allergies  Allergen Reactions  . Penicillins Hives    Has patient had a PCN reaction causing immediate rash, facial/tongue/throat swelling, SOB or  lightheadedness with hypotension: Yes Has patient had a PCN reaction causing severe rash involving mucus membranes or skin necrosis: No Has patient had a PCN reaction that required hospitalization: No Has patient had a PCN reaction occurring within the last 10 years: No If all of the above answers are "NO", then may proceed with Cephalosporin use.     Consultations:  Neuro   Procedures (Echo, Carotid, EGD, Colonoscopy, ERCP)   Radiological studies: Ct Angio Head W Or Wo Contrast  Result Date: 06/27/2016 CLINICAL DATA:  83 y/o F; history of stroke, TIA, and atrial fibrillation. EXAM: CT ANGIOGRAPHY HEAD AND NECK TECHNIQUE: Multidetector CT imaging of the head and neck was performed using the standard protocol during bolus administration of intravenous contrast. Multiplanar CT image reconstructions and MIPs were obtained to evaluate the vascular anatomy. Carotid stenosis measurements (when applicable) are obtained utilizing NASCET criteria, using the distal internal carotid diameter as the denominator. CONTRAST:  50 cc Isovue 370 COMPARISON:  06/25/2016 MRI of the brain. FINDINGS: CT HEAD FINDINGS Brain: Small right frontal lobe and right inferior cerebellar hemisphere chronic infarcts. Nonspecific left posterior  temporal calcification, likely sequelae of prior infectious/inflammatory process. Moderate chronic microvascular ischemic changes of white matter and moderate brain parenchymal volume loss. Small infarcts on prior MRI are not appreciable on CT. No hydrocephalus, intracranial hemorrhage, or effacement of basilar cisterns. No evidence for new large territory infarct. Incidental subcentimeter anterior falx lipoma. Vascular: As below. Skull: Normal. Negative for fracture or focal lesion. Sinuses: Imaged portions are clear. Orbits: Bilateral intra-ocular lens replacement. Review of the MIP images confirms the above findings CTA NECK FINDINGS Aortic arch: Aberrant right subclavian artery. Mild  calcific atherosclerosis of the aortic arch. No evidence for dissection, vasculitis, or significant stenosis of great vessel origins. Right carotid system: Patent right common carotid artery. Complete occlusion of the right internal carotid artery two-view terminus. Left carotid system: Patent left common carotid artery. Near complete occlusion of the left internal carotid artery in the neck with intermittent thread-like opacity. Vertebral arteries: Left dominant. Dense calcification of right vertebral artery origin with probable stenosis, accurate assessment is limited due to the dense calcification. Widely patent left vertebral artery. Skeleton: Cervical spondylosis with moderate bilateral facet degenerative changes most pronounced at the C3 through C5 levels and severe discogenic degenerative changes at C5 through C7. Uncovertebral hypertrophy results in foraminal narrowing greatest at the left C6-7 and right C5-6 levels. No high-grade bony canal stenosis. Other neck: Negative. Upper chest: Negative. Review of the MIP images confirms the above findings CTA HEAD FINDINGS Anterior circulation: Complete occlusion of the right internal carotid artery to the terminus. The left internal carotid artery is occluded in the petrous and cavernous segments with minimal patency of paraclinoid and terminal segments. Bilateral anterior cerebral artery and middle cerebral artery distributions are patent although the vessels are diffusely diminished in caliber. Posterior circulation: Mild irregularity of the basilar artery with short segment of mid stenosis of the mid segment. Mild bilateral P1 segments of stenosis. Otherwise no evidence for high-grade stenosis, aneurysm, vascular malformation, or proximal occlusion. Venous sinuses: As permitted by contrast timing, patent. Anatomic variants: Small anterior communicating artery. No posterior communicating artery identified, likely hypoplastic or absent. Delayed phase: No abnormal  intracranial enhancement. Review of the MIP images confirms the above findings IMPRESSION: 1. Small right frontal and right inferior cerebellar hemisphere chronic infarcts. 2. Small acute infarcts on the prior MRI of the brain are not appreciable on CT. 3. Moderate chronic microvascular ischemic changes and moderate parenchymal volume loss of the brain. 4. Complete occlusion of right internal carotid artery to the terminus and near complete occlusion of the left internal carotid artery with minimal patency of left paraclinoid and terminal segments. 5. Patent circle of Willis without large vessel occlusion, aneurysm, or vascular malformation. 6. Diffusely diminished caliber of the anterior cerebral artery and middle cerebral artery distributions. 7. Moderate midbasilar and bilateral P1 segment foci of stenosis compatible with intracranial atherosclerosis. These results will be called to the ordering clinician or representative by the Radiologist Assistant, and communication documented in the PACS or zVision Dashboard. Electronically Signed   By: Mitzi Hansen M.D.   On: 06/27/2016 00:02   Ct Head Wo Contrast  Result Date: 06/23/2016 CLINICAL DATA:  Fall, headache, neck pain, dementia EXAM: CT HEAD WITHOUT CONTRAST CT CERVICAL SPINE WITHOUT CONTRAST TECHNIQUE: Multidetector CT imaging of the head and cervical spine was performed following the standard protocol without intravenous contrast. Multiplanar CT image reconstructions of the cervical spine were also generated. COMPARISON:  MRI brain dated 07/31/2014 FINDINGS: CT HEAD FINDINGS Brain: No evidence of acute infarction, hemorrhage,  hydrocephalus, extra-axial collection or mass lesion/mass effect. Subcortical white matter and periventricular small vessel ischemic changes. Encephalomalacic changes in the subcortical right frontal lobe (series 4/ image 21). Encephalomalacic changes related to old inferior right cerebellar infarct (series 4/ image 5).  Global cortical atrophy.  Secondary ventricular prominence. Vascular: Intracranial atherosclerosis. Skull: Normal. Negative for fracture or focal lesion. Sinuses/Orbits: The visualized paranasal sinuses are essentially clear. The mastoid air cells are unopacified. Other: None. CT CERVICAL SPINE FINDINGS Alignment: Normal cervical lordosis. Skull base and vertebrae: No acute fracture. No primary bone lesion or focal pathologic process. Soft tissues and spinal canal: No prevertebral fluid or swelling. No visible canal hematoma. Disc levels: Moderate degenerative changes of the lower cervical spine. Spinal canal is patent. Upper chest: Visualized lung apices are clear. Other: Visualized thyroid is unremarkable. IMPRESSION: No evidence of acute intracranial abnormality. Old right frontal and right cerebellar infarcts. Atrophy with small vessel ischemic changes. No evidence of traumatic injury to the cervical spine. Moderate degenerative changes. Electronically Signed   By: Charline Bills M.D.   On: 06/23/2016 10:07   Ct Angio Neck W Or Wo Contrast  Result Date: 06/27/2016 CLINICAL DATA:  83 y/o F; history of stroke, TIA, and atrial fibrillation. EXAM: CT ANGIOGRAPHY HEAD AND NECK TECHNIQUE: Multidetector CT imaging of the head and neck was performed using the standard protocol during bolus administration of intravenous contrast. Multiplanar CT image reconstructions and MIPs were obtained to evaluate the vascular anatomy. Carotid stenosis measurements (when applicable) are obtained utilizing NASCET criteria, using the distal internal carotid diameter as the denominator. CONTRAST:  50 cc Isovue 370 COMPARISON:  06/25/2016 MRI of the brain. FINDINGS: CT HEAD FINDINGS Brain: Small right frontal lobe and right inferior cerebellar hemisphere chronic infarcts. Nonspecific left posterior temporal calcification, likely sequelae of prior infectious/inflammatory process. Moderate chronic microvascular ischemic changes of  white matter and moderate brain parenchymal volume loss. Small infarcts on prior MRI are not appreciable on CT. No hydrocephalus, intracranial hemorrhage, or effacement of basilar cisterns. No evidence for new large territory infarct. Incidental subcentimeter anterior falx lipoma. Vascular: As below. Skull: Normal. Negative for fracture or focal lesion. Sinuses: Imaged portions are clear. Orbits: Bilateral intra-ocular lens replacement. Review of the MIP images confirms the above findings CTA NECK FINDINGS Aortic arch: Aberrant right subclavian artery. Mild calcific atherosclerosis of the aortic arch. No evidence for dissection, vasculitis, or significant stenosis of great vessel origins. Right carotid system: Patent right common carotid artery. Complete occlusion of the right internal carotid artery two-view terminus. Left carotid system: Patent left common carotid artery. Near complete occlusion of the left internal carotid artery in the neck with intermittent thread-like opacity. Vertebral arteries: Left dominant. Dense calcification of right vertebral artery origin with probable stenosis, accurate assessment is limited due to the dense calcification. Widely patent left vertebral artery. Skeleton: Cervical spondylosis with moderate bilateral facet degenerative changes most pronounced at the C3 through C5 levels and severe discogenic degenerative changes at C5 through C7. Uncovertebral hypertrophy results in foraminal narrowing greatest at the left C6-7 and right C5-6 levels. No high-grade bony canal stenosis. Other neck: Negative. Upper chest: Negative. Review of the MIP images confirms the above findings CTA HEAD FINDINGS Anterior circulation: Complete occlusion of the right internal carotid artery to the terminus. The left internal carotid artery is occluded in the petrous and cavernous segments with minimal patency of paraclinoid and terminal segments. Bilateral anterior cerebral artery and middle cerebral  artery distributions are patent although the vessels are diffusely diminished  in caliber. Posterior circulation: Mild irregularity of the basilar artery with short segment of mid stenosis of the mid segment. Mild bilateral P1 segments of stenosis. Otherwise no evidence for high-grade stenosis, aneurysm, vascular malformation, or proximal occlusion. Venous sinuses: As permitted by contrast timing, patent. Anatomic variants: Small anterior communicating artery. No posterior communicating artery identified, likely hypoplastic or absent. Delayed phase: No abnormal intracranial enhancement. Review of the MIP images confirms the above findings IMPRESSION: 1. Small right frontal and right inferior cerebellar hemisphere chronic infarcts. 2. Small acute infarcts on the prior MRI of the brain are not appreciable on CT. 3. Moderate chronic microvascular ischemic changes and moderate parenchymal volume loss of the brain. 4. Complete occlusion of right internal carotid artery to the terminus and near complete occlusion of the left internal carotid artery with minimal patency of left paraclinoid and terminal segments. 5. Patent circle of Willis without large vessel occlusion, aneurysm, or vascular malformation. 6. Diffusely diminished caliber of the anterior cerebral artery and middle cerebral artery distributions. 7. Moderate midbasilar and bilateral P1 segment foci of stenosis compatible with intracranial atherosclerosis. These results will be called to the ordering clinician or representative by the Radiologist Assistant, and communication documented in the PACS or zVision Dashboard. Electronically Signed   By: Mitzi Hansen M.D.   On: 06/27/2016 00:02   Ct Cervical Spine Wo Contrast  Result Date: 06/23/2016 CLINICAL DATA:  Fall, headache, neck pain, dementia EXAM: CT HEAD WITHOUT CONTRAST CT CERVICAL SPINE WITHOUT CONTRAST TECHNIQUE: Multidetector CT imaging of the head and cervical spine was performed  following the standard protocol without intravenous contrast. Multiplanar CT image reconstructions of the cervical spine were also generated. COMPARISON:  MRI brain dated 07/31/2014 FINDINGS: CT HEAD FINDINGS Brain: No evidence of acute infarction, hemorrhage, hydrocephalus, extra-axial collection or mass lesion/mass effect. Subcortical white matter and periventricular small vessel ischemic changes. Encephalomalacic changes in the subcortical right frontal lobe (series 4/ image 21). Encephalomalacic changes related to old inferior right cerebellar infarct (series 4/ image 5). Global cortical atrophy.  Secondary ventricular prominence. Vascular: Intracranial atherosclerosis. Skull: Normal. Negative for fracture or focal lesion. Sinuses/Orbits: The visualized paranasal sinuses are essentially clear. The mastoid air cells are unopacified. Other: None. CT CERVICAL SPINE FINDINGS Alignment: Normal cervical lordosis. Skull base and vertebrae: No acute fracture. No primary bone lesion or focal pathologic process. Soft tissues and spinal canal: No prevertebral fluid or swelling. No visible canal hematoma. Disc levels: Moderate degenerative changes of the lower cervical spine. Spinal canal is patent. Upper chest: Visualized lung apices are clear. Other: Visualized thyroid is unremarkable. IMPRESSION: No evidence of acute intracranial abnormality. Old right frontal and right cerebellar infarcts. Atrophy with small vessel ischemic changes. No evidence of traumatic injury to the cervical spine. Moderate degenerative changes. Electronically Signed   By: Charline Bills M.D.   On: 06/23/2016 10:07   Mr Brain Wo Contrast  Result Date: 06/25/2016 CLINICAL DATA:  Seizure EXAM: MRI HEAD WITHOUT CONTRAST TECHNIQUE: Multiplanar, multiecho pulse sequences of the brain and surrounding structures were obtained without intravenous contrast. COMPARISON:  CT 06/17/2013.  MRI 07/31/2014 FINDINGS: The patient was not able to complete the  exam. Several sequences were obtained which are degraded by motion. Brain: Small subcentimeter areas of restricted diffusion in the left frontal cortex over the convexity most compatible with small areas of acute infarct likely embolic. Generalized atrophy with ventricular enlargement which is stable from the prior study. Mild chronic microvascular ischemic changes in the white matter. Chronic infarct in the  right frontal lobe unchanged. No fluid collection or shift of the midline structures. No mass lesion identified. Vascular: Increased signal the right internal carotid artery compatible with occlusion as noted on the prior study. Left cavernous carotid difficult to evaluate due to motion. Skull and upper cervical spine: Negative Sinuses/Orbits: Negative Other: None IMPRESSION: Examination is limited by motion. In addition, patient was not able to complete the study. Small subcentimeter areas of restricted diffusion in the left frontal cortex over the convexity most compatible with acute infarct. 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 Left cavernous carotid could be occluded but is difficult to evaluate due to motion. CTA suggested for further evaluation. Electronically Signed   By: Marlan Palau M.D.   On: 06/25/2016 16:44   Dg Foot Complete Right  Result Date: 06/23/2016 CLINICAL DATA:  Fall.  Right foot pain. EXAM: RIGHT FOOT COMPLETE - 3+ VIEW COMPARISON:  None. FINDINGS: Right second toe amputation. No acute osseous fracture. No dislocation. Healed deformity in the medial distal right first metatarsal is probably postsurgical. Mild first metatarsal-phalangeal joint osteoarthritis. Small plantar right calcaneal spur. No radiopaque foreign body. IMPRESSION: Right second toe amputation. No acute fracture or malalignment in the right foot. Electronically Signed   By: Delbert Phenix M.D.   On: 06/23/2016 13:11   Dg Hip Unilat With Pelvis Min 4 Views Right  Result Date: 06/23/2016 CLINICAL DATA:   Status post fall, confusion, possible right hip pain. Patient un able to communicate. EXAM: DG HIP (WITH OR WITHOUT PELVIS) 4+V RIGHT COMPARISON:  None available in PACs. FINDINGS: The bones are subjectively osteopenic. No acute pelvic fracture is observed. There is moderate narrowing of the right hip joint space. No acute fracture or dislocation of the right hip is observed. The femoral neck and intertrochanteric and sub trochanteric regions appear normal. IMPRESSION: There is no acute or significant chronic bony abnormality of the right hip. Electronically Signed   By: David  [place] M.D.   On: 06/23/2016 11:20   Ct Head Code Stroke W/o Cm  Result Date: 06/25/2016 CLINICAL DATA:  Code stroke. 83 year old female with weakness, confusion, slurred speech. Initial encounter. EXAM: CT HEAD WITHOUT CONTRAST TECHNIQUE: Contiguous axial images were obtained from the base of the skull through the vertex without intravenous contrast. COMPARISON:  06/23/2016 and earlier. FINDINGS: Brain: No acute intracranial hemorrhage identified. No midline shift, mass effect, or evidence of intracranial mass lesion. Chronic appearing encephalomalacia in the inferior right cerebellum and anterior right frontal operculum appear stable since the brain MRI on 07/31/2014. Mild for age superimposed bilateral white matter hypodensity appears stable. Stable cerebral volume. Stable ventricle size and configuration. No cortically based acute infarct identified. Vascular: Calcified atherosclerosis at the skull base. No suspicious intracranial vascular hyperdensity. Skull: Stable.  No acute osseous abnormality identified. Sinuses/Orbits: Visualized paranasal sinuses and mastoids are stable and well pneumatized. Other: Negative orbit and scalp soft tissues. ASPECTS ([place]ber[place] Stroke Program Early CT Score) - Ganglionic level infarction (caudate, lentiform nuclei, internal capsule, insula, M1-M3 cortex): 7 - Supraganglionic infarction (M4-M6 cortex):  3 Total score (0-10 with 10 being normal): 10 IMPRESSION: 1. Stable non contrast CT appearance of the brain, no acute intracranial hemorrhage or cortically based infarct identified. 2. ASPECTS is 10. 3. The above was relayed via text pager to Dr. Jerel Shepherd on 06/25/2016 at 13:50 . Electronically Signed   By: Odessa Fleming M.D.   On: 06/25/2016 13:51     Subjective:  Discharge Exam: Vitals:   06/29/16 2105 06/30/16 0104 06/30/16  0500 06/30/16 0920  BP: (!) 141/72 123/75 (!) 142/71 (!) 148/70  Pulse: 90 77 77 67  Resp: Temp: 98.9 F (37.2 C) 98.2 F (36.8 C) 97.5 F (36.4 C) 98.5 F (36.9 C)  TempSrc: Axillary Axillary Oral Axillary  SpO2: 98% 99% 98% 100%  Weight:      Height:       General: Pt is alert, awake, not in acute distress Cardiovascular: RRR, S1/S2 +, no rubs, no gallops Respiratory: CTA bilaterally, no wheezing, no rhonchi Abdominal: Soft, NT, ND, bowel sounds + Extremities: no edema, no cyanosis   The results of significant diagnostics from this hospitalization (including imaging, microbiology, ancillary and laboratory) are listed below for reference.    Microbiology: Recent Results (from the past 240 hour(s))  Urine culture     Status: Abnormal   Collection Time: 06/23/16  9:08 AM  Result Value Ref Range Status   Specimen Description URINE, CLEAN CATCH  Final   Special Requests NONE  Final   Culture MULTIPLE SPECIES PRESENT, SUGGEST RECOLLECTION (A)  Final   Report Status 06/24/2016 FINAL  Final  MRSA PCR Screening     Status: None   Collection Time: 06/25/16  8:28 PM  Result Value Ref Range Status   MRSA by PCR NEGATIVE NEGATIVE Final    Comment:        The GeneXpert MRSA Assay (FDA approved for NASAL specimens only), is one component of a comprehensive MRSA colonization surveillance program. It is not intended to diagnose MRSA infection nor to guide or monitor treatment for MRSA infections.      Labs: BNP (last 3 results)  Recent Labs   02/25/16 1448  BNP 246.8*   Basic Metabolic Panel:  Recent Labs Lab 06/26/16 0329 06/27/16 0315 06/28/16 0513 06/29/16 0258 06/30/16 0628  NA 145 144 145 146* 144  K 3.5 4.0 3.9 3.6 3.3*  CL 109 109 110 113* 113*  CO2 GLUCOSE 104* 147* 102* 89 112*  BUN CREATININE 0.85 1.01* 0.97 0.94 0.82  CALCIUM 8.7* 9.3 8.8* 8.5* 8.5*   Liver Function Tests:  Recent Labs Lab 06/25/16 1335  AST 24  ALT 18  ALKPHOS 90  BILITOT 1.0  PROT 6.0*  ALBUMIN 3.5   No results for input(s): LIPASE, AMYLASE in the last 168 hours. No results for input(s): AMMONIA in the last 168 hours. CBC:  Recent Labs Lab 06/25/16 1335  06/26/16 0329 06/27/16 0315 06/28/16 0513 06/29/16 0258 06/30/16 0628  WBC 8.0  --  6.6 12.0* 8.2 6.0 6.1  NEUTROABS 6.0  --   --   --   --   --   --   HGB 13.3  < > 12.7 14.6 13.2 12.3 11.8*  HCT 39.6  < > 38.2 44.0 39.5 37.5 35.8*  MCV 91.9  --  92.0 91.1 91.9 93.5 92.5  PLT 140*  --  130* 176 123* 111* 116*  < > = values in this interval not displayed. Cardiac Enzymes: No results for input(s): CKTOTAL, CKMB, CKMBINDEX, TROPONINI in the last 168 hours. BNP: Invalid input(s): POCBNP CBG:  Recent Labs Lab 06/29/16 1241 06/29/16 1609 06/29/16 2207 06/30/16 0628 06/30/16 1128  GLUCAP 120* 94 98 111* 94   D-Dimer No results for input(s): DDIMER in the last 72 hours. Hgb A1c No results for input(s): HGBA1C in the last 72 hours. Lipid Profile No results for input(s): CHOL, HDL, LDLCALC,  TRIG, CHOLHDL, LDLDIRECT in the last 72 hours. Thyroid function studies No results for input(s): TSH, T4TOTAL, T3FREE, THYROIDAB in the last 72 hours.  Invalid input(s): FREET3 Anemia work up No results for input(s): VITAMINB12, FOLATE, FERRITIN, TIBC, IRON, RETICCTPCT in the last 72 hours. Urinalysis    Component Value Date/Time   COLORURINE YELLOW 06/25/2016 1710   APPEARANCEUR CLEAR 06/25/2016 1710   LABSPEC 1.023 06/25/2016 1710    PHURINE 5.0 06/25/2016 1710   GLUCOSEU NEGATIVE 06/25/2016 1710   HGBUR NEGATIVE 06/25/2016 1710   BILIRUBINUR NEGATIVE 06/25/2016 1710   KETONESUR NEGATIVE 06/25/2016 1710   PROTEINUR NEGATIVE 06/25/2016 1710   UROBILINOGEN 1.0 03/28/2012 1119   NITRITE NEGATIVE 06/25/2016 1710   LEUKOCYTESUR NEGATIVE 06/25/2016 1710   Sepsis Labs Invalid input(s): PROCALCITONIN,  WBC,  LACTICIDVEN Microbiology Recent Results (from the past 240 hour(s))  Urine culture     Status: Abnormal   Collection Time: 06/23/16  9:08 AM  Result Value Ref Range Status   Specimen Description URINE, CLEAN CATCH  Final   Special Requests NONE  Final   Culture MULTIPLE SPECIES PRESENT, SUGGEST RECOLLECTION (A)  Final   Report Status 06/24/2016 FINAL  Final  MRSA PCR Screening     Status: None   Collection Time: 06/25/16  8:28 PM  Result Value Ref Range Status   MRSA by PCR NEGATIVE NEGATIVE Final    Comment:        The GeneXpert MRSA Assay (FDA approved for NASAL specimens only), is one component of a comprehensive MRSA colonization surveillance program. It is not intended to diagnose MRSA infection nor to guide or monitor treatment for MRSA infections.      Time coordinating discharge: Over 30 minutes  SIGNED:   Clint Lipps, MD  [facility] 06/30/2016, 12:48 PM Pager   If 7PM-7AM, please contact night-coverage [email or web address] Password [email or web address]

## 2016-06-30 NOTE — NC FL2 (Signed)
[place] MEDICAID FL2 LEVEL OF CARE SCREENING TOOL     IDENTIFICATION  Patient Name: Heidi Miranda Birthdate: 1932-12-29 Sex: female Admission Date (Current Location): 06/25/2016  [facility] and [place] Number:  Producer, television/film/video and Address:  [facility] [place]. [facility], [address] [place], [place] [address]      Provider Number: [id number]  Attending Physician Name and Address:  Clydia Llano, MD  Relative Name and Phone Number:       Current Level of Care: Hospital Recommended Level of Care: Skilled Nursing Facility Prior Approval Number:    Date Approved/Denied:   PASRR Number: [id number]  Discharge Plan: SNF    Current Diagnoses: Patient Active Problem List   Diagnosis Date Noted  . Bilateral carotid artery occlusion   . Seizure (HCC) 06/25/2016  . H/O: CVA (cerebrovascular accident) 06/25/2016  . Dementia without behavioral disturbance 06/25/2016  . Weakness generalized 06/25/2016  . Acute CVA (cerebrovascular accident) (HCC) 06/25/2016  . Chronic anticoagulation 11/15/2014  . Cognitive decline 07/15/2014  . Atrial fibrillation (HCC) 03/28/2012  . Acute CHF (HCC) 03/28/2012  . SOB (shortness of breath) 03/28/2012  . HTN (hypertension) 03/28/2012  . Forgetfulness 03/28/2012    Orientation RESPIRATION BLADDER Height & Weight     Self  O2 (2L) Incontinent Weight: 172 lb 9.9 oz (78.3 kg) Height:   (167.6 cm)  BEHAVIORAL SYMPTOMS/MOOD NEUROLOGICAL BOWEL NUTRITION STATUS  Other (Comment) (Memory impairment) Convulsions/Seizures (Hx of tonic-clonic seizures) Continent Diet (Dysphasia 1 diet; nectar thick fluids)  AMBULATORY STATUS COMMUNICATION OF NEEDS Skin   Extensive Assist Verbally Normal                       Personal Care Assistance Level of Assistance  Bathing, Feeding, Dressing Bathing Assistance: Maximum assistance Feeding assistance: Maximum assistance Dressing Assistance: Maximum assistance     Functional Limitations  Info  Sight, Speech, Hearing Sight Info: Adequate Hearing Info: Adequate Speech Info: Adequate    SPECIAL CARE FACTORS FREQUENCY  PT (By licensed PT), OT (By licensed OT)     PT Frequency: 2x OT Frequency: 3x            Contractures Contractures Info: Not present    Additional Factors Info  Code Status, Allergies, Psychotropic, Insulin Sliding Scale Code Status Info: DNR Allergies Info: Penicillins Psychotropic Info: citalopram (CELEXA); QUEtiapine (SEROQUEL) Insulin Sliding Scale Info: See med list       Current Medications (06/30/2016):  This is the current hospital active medication list Current Facility-Administered Medications  Medication Dose Route Frequency Provider Last Rate Last Dose  . 0.9 %  sodium chloride infusion   Intravenous Continuous Clydia Llano, MD 50 mL/hr at 06/30/16 0454    . acetaminophen (TYLENOL) tablet 650 mg  650 mg Oral Q6H PRN Clydia Llano, MD       Or  . acetaminophen (TYLENOL) suppository 650 mg  650 mg Rectal Q6H PRN Clydia Llano, MD      . acetaminophen (TYLENOL) tablet 650 mg  650 mg Oral Q6H PRN Clydia Llano, MD      . apixaban (ELIQUIS) tablet 5 mg  5 mg Oral BID Clydia Llano, MD   5 mg at 06/30/16 0936  . atorvastatin (LIPITOR) tablet 20 mg  20 mg Oral q1800 Clydia Llano, MD   20 mg at 06/29/16 1845  . chlorhexidine (PERIDEX) 0.12 % solution 15 mL  15 mL Mouth Rinse BID Clydia Llano, MD   15 mL at 06/30/16 0936  .  citalopram (CELEXA) tablet 20 mg  20 mg Oral q morning - 10a Clydia Llano, MD   20 mg at 06/30/16 0936  . HYDROcodone-acetaminophen (NORCO/VICODIN) 5-325 MG per tablet 1-2 tablet  1-2 tablet Oral Q4H PRN Clydia Llano, MD      . insulin aspart (novoLOG) injection 0-9 Units  0-9 Units Subcutaneous TID WC Marvel Plan, MD      . lacosamide (VIMPAT) 100 mg in sodium chloride 0.9 % 25 mL IVPB  100 mg Intravenous Q12H Marvel Plan, MD   100 mg at 06/30/16 1048  . LORazepam (ATIVAN) injection 1-2 mg  1-2 mg Intravenous Q2H PRN Clydia Llano, MD   2 mg at 06/27/16 0002  . metoprolol tartrate (LOPRESSOR) tablet 25 mg  25 mg Oral BID Clydia Llano, MD   25 mg at 06/30/16 0936  . multivitamin with minerals tablet 1 tablet  1 tablet Oral Daily Clydia Llano, MD   1 tablet at 06/30/16 0936  . ondansetron (ZOFRAN) tablet 4 mg  4 mg Oral Q6H PRN Clydia Llano, MD       Or  . ondansetron (ZOFRAN) injection 4 mg  4 mg Intravenous Q6H PRN Clydia Llano, MD      . Melene Muller ON 07/01/2016] pantoprazole (PROTONIX) EC tablet 40 mg  40 mg Oral Daily Clydia Llano, MD      . QUEtiapine (SEROQUEL) tablet 25 mg  25 mg Oral QHS Clydia Llano, MD   25 mg at 06/29/16 2105  . sodium chloride flush (NS) 0.9 % injection 3 mL  3 mL Intravenous Q12H Clydia Llano, MD   3 mL at 06/30/16 4782     Discharge Medications: Please see discharge summary for a list of discharge medications.  Relevant Imaging Results:  Relevant Lab Results:   Additional Information SSN: [id number]  Dominic Pea, LCSW

## 2016-08-21 ENCOUNTER — Ambulatory Visit (INDEPENDENT_AMBULATORY_CARE_PROVIDER_SITE_OTHER): Payer: Medicare Other | Admitting: Neurology

## 2016-08-21 ENCOUNTER — Encounter: Payer: Self-pay | Admitting: Neurology

## 2016-08-21 VITALS — BP 140/71 | HR 75 | Ht 64.0 in | Wt 175.0 lb

## 2016-08-21 DIAGNOSIS — G8110 Spastic hemiplegia affecting unspecified side: Secondary | ICD-10-CM

## 2016-08-21 DIAGNOSIS — I639 Cerebral infarction, unspecified: Secondary | ICD-10-CM

## 2016-08-21 DIAGNOSIS — I6349 Cerebral infarction due to embolism of other cerebral artery: Secondary | ICD-10-CM

## 2016-08-21 DIAGNOSIS — F0390 Unspecified dementia without behavioral disturbance: Secondary | ICD-10-CM

## 2016-08-21 NOTE — Patient Instructions (Signed)
Overall you are doing fairly well but I do want to suggest a few things today:   Remember to drink plenty of fluid, eat healthy meals and do not skip any meals. Try to eat protein with a every meal and eat a healthy snack such as fruit or nuts in between meals. Try to keep a regular sleep-wake schedule and try to exercise daily, particularly in the form of walking, 20-30 minutes a day, if you can.   As far as your medications are concerned, I would like to suggest: Continue current medication  I would like to see you back in 3-4 months, sooner if we need to. Please call us with any interim questions, concerns, problems, updates or refill requests.   Our phone number is [phone]. We also have an after hours call service for urgent matters and there is a physician on-call for urgent questions. For any emergencies you know to call 911 or go to the nearest emergency room

## 2016-08-21 NOTE — Progress Notes (Addendum)
[facility]    Provider:  Dr Lucia Gaskins Referring Provider: Tally Joe, MD Primary Care Physician:  Tally Joe, MD  CC:  Follow up on stroke  Interval history 08/21/2016: This is an 83 year old female with a past medical history of hypertension, hyperlipidemia, diabetes, A. fib on blood thinners, obstructive sleep apnea, dementia.  Patient was initially seen for cognitive complaints and 2016. Patient returns today after hospitalization. Patient was seen in the emergency room 06/25/2016 for a code stroke. Reviewed notes show that patient had 4-5 seizure episodes but falls the previous week. She was seen in the emergency room CT of the head showed no bleeding and she was discharged home. The morning she was seen for admission she was eating lunch and husband found her to constantly drop performed in the right hand with intermittent shaking and then had a full-blown seizure with loss of consciousness and whole body shaking. Husband then noticed a right racial droop. In the emergency room she had a right arm drift but no right facial droop and she did not follow commands. CT anterior showed complete occlusion of the right internal carotid artery and near complete occluded of the left internal carotid artery. TEE showed 30-35% ejection fraction with diffuse hypokinesis. EEG was consistent with a mild generalized nonspecific cerebral dysfunction without seizure or seizure predisposition. Patient was diagnosed with seizure with possible Todd's paralysis. She was started on Vimpat 100 mg twice daily. MRI showed left frontal ACA and MCA infarcts most likely due to left ICA occlusion versus A. fib even on blood thinners. Left ICA occlusion is new from May 2016. The right ICA occlusion was chronic. Aspirin was added due to severe atherosclerosis especially of the ICAs.  Here with her husband who provides all information. She has weakness of the right arm. Sheis at [employer]. She is  getting physical therapy. She sees a cardiologist. Discussed asa was added due to atherosclerosis. She failed Aricept and Nameda and is not interested in namenda. Her pcp titrated the seizure medication. No more seizures. Husband understands the risks of seizure and would still like to keep her off of seizure medication and place her back on if there is another seizure. I recommend continuing seizure medication for at least 6 months. Her first seizures was a week or two before going to the hospital for the arm weakness.   HPI 07/10/2014:  ALBERTINE LAFOY is a 83 y.o. female here as a referral from Dr. Azucena Cecil for memory problems  Memory problems started a " long time ago" unknown how long. Husband providesmost of the infpormation. 5-8 years ago. She is a poor historian. She can't remember the day. She can't rememebr appointments. She can take a phone call in the living room and make an appointment and walk tot kitchen then can't remember what it was. Progressive. Her daughter had her brain tested because she was also getting forgetful. Shows the "part of her brain that has her short term memory is dieing". Her daughetr is 61. Patient doesn't like to drive, not interested in hobbies naymore, loves to see family that thrills her. More new memories having a difficult time with. She can't do things that are complicated, she can't use computers. She doesn't cook anymore, they make sandwhiches. She has not left the stove on or had any accident. Husband pays the bills. She slipped in the shower. No head trauma. She is depressed because she doesn't have any friends, she just stays at home all the time  and she is afraid all the time. No tremor  Reviewed notes, labs and imaging from outside physicians, which showed: B12, TSH wnl.   Review of Systems: Patient complains of symptoms per HPI as well as the following symptoms; SOB, cough, snoring, hearing loss, incontinence, joint pain, allergies, runny nose, memory loss,  confusion, headaches, numbness, weakness, sleepiness, snoring, depression,anxiety, disinterest inactivities, hallucinations. Pertinent negatives per HPI. All others negative.    Social History   Social History  . Marital status: Married    Spouse name: [name]  . Number of children: 6  . Years of education: HS   Occupational History  . Retired    Social History Main Topics  . Smoking status: Never Smoker  . Smokeless tobacco: Never Used  . Alcohol use 0.0 oz/week     Comment: 05/2016 occasional  . Drug use: No  . Sexual activity: Not on file   Other Topics Concern  . Not on file   Social History Narrative   Pt lives at home with her spouse, now at [employer]    Right-handed   Caffeine Use: occasional coffee and tea    Family History  Problem Relation Age of Onset  . Diabetes Brother   . Heart attack Brother   . Alcohol abuse Brother   . Hypertension Mother   . Breast cancer Mother   . Heart attack Father   . Alcohol abuse Father   . Hypertension Sister   . Stroke Maternal Grandmother   . Stroke Paternal Grandmother   . Hypertension Sister     Past Medical History:  Diagnosis Date  . Anxiety and depression   . Atrial fibrillation (HCC)   . Back pain   . Chronic kidney disease   . Chronic neck pain   . CVA (cerebral infarction)    , right parietal region  . Dementia   . GERD (gastroesophageal reflux disease)   . Hemorrhoids   . Hyperlipidemia   . Hypertension   . Hypertension   . OA (osteoarthritis) of knee   . Pre-diabetes   . Seizure (HCC)   . Sleep apnea    , (Split 02/10/06 AHI 24/hr, O2 mi n 89%; CPAP 7 cm water pressure)  . Stroke (HCC)   . TIA (transient ischemic attack)     Past Surgical History:  Procedure Laterality Date  . KNEE SURGERY      Current Outpatient Prescriptions  Medication Sig Dispense Refill  . acetaminophen (TYLENOL) 325 MG tablet Take 650 mg by mouth every 6 (six) hours as needed for mild pain or headache.     Marland Kitchen apixaban  (ELIQUIS) 5 MG TABS tablet Take 1 tablet (5 mg total) by mouth 2 (two) times daily. 60 tablet 6  . aspirin EC 81 MG tablet Take 1 tablet (81 mg total) by mouth daily.    Marland Kitchen atorvastatin (LIPITOR) 20 MG tablet Take 1 tablet (20 mg total) by mouth every morning. 90 tablet 1  . CALCIUM PO Take 500 mg by mouth daily.     . citalopram (CELEXA) 20 MG tablet Take 20 mg by mouth every morning.     . lacosamide (VIMPAT) 50 MG TABS tablet Take 50 mg by mouth daily.    . lansoprazole (PREVACID 24HR) 15 MG capsule Take 15 mg by mouth daily as needed (for reflux/GERD).    Marland Kitchen metoprolol succinate (TOPROL-XL) 25 MG 24 hr tablet Take 3 tablets (75 mg total) by mouth daily. 270 tablet 3  . Multiple Vitamin (MULTIVITAMIN  WITH MINERALS) TABS Take 1 tablet by mouth daily.     No current facility-administered medications for this visit.     Allergies as of 08/21/2016 - Review Complete 08/21/2016  Allergen Reaction Noted  . Penicillins Hives 03/22/2012    Vitals: BP 140/71   Pulse 75   Ht 5\' 4"  (1.626 m)   Wt 175 lb (79.4 kg)   BMI 30.04 kg/m  Last Weight:  Wt Readings from Last 1 Encounters:  08/21/16 175 lb (79.4 kg)   Last Height:   Ht Readings from Last 1 Encounters:  08/21/16 5\' 4"  (1.626 m)     Physical exam: Exam: Gen: NAD, pleasant, alert               CV: irregular Eyes: Conjunctivae clear without exudates or hemorrhage  Neuro: Detailed Neurologic Exam  Cognition:     The patient is oriented to person    recent memory impaired and remote memory impaired;     language without aphasia;     Impaired attention, concentration, fund of knowledge Cranial Nerves:    The pupils are equal, round, and reactive to light.  Extraocular movements are intact. Trigeminal sensation is intact and the muscles of mastication are normal. The face is symmetric. The palate elevates in the midline. Hearing intact. Voice is normal. Shoulder shrug is normal. The tongue has normal motion without  fasciculations.   Coordination:    Normal  Motor Observation:     no involuntary movements noted. Tone:    Increased right side    Strength:  Difficult exam due to dementia but has right hemiparesis         Assessment/Plan:   This is an 83 year old female with a past medical history of hypertension, hyperlipidemia, diabetes, A. fib on blood thinners, obstructive sleep apnea, dementia. She was recently admitted to [facility] at the end of March of this year due to seizures possibly causing Todd's paralysis. Also seen on MRI of the brain for several left frontal ACA and MCA infarcts most likely due to left ICA occlusion versus A. fib even on blood thinners. She has chronic complete right ICA occlusion and new near total left ICA occlusion. TEE showed diffuse hypokinesis and an EF of 30-35%. Reviewed labs, LDL was 63 and hemoglobin Z6X was 5.4. Aspirin was added in addition to her Eliquis.  Spastic hemiparesis: Increased tone: Daily stretching asked husband to talk to PT for instruction at [employer] or in the future we can consider botox.   Dementia: Declined aricept, namenda of any other medication for memory loss or dementia treatment  Seizures: I recommend continuing seizure medication. Husband declines, if she has another seizure he will restart seizure medicationsin conjunction with pcp. Discussed risks of future seizures including mortality. Seizure precations.   OSA: recommended compliance, can increase risk of stroke  Needs continued PT, OT as I believe she can still improve  I had a long d/w patient about her recent stroke, risk for recurrent stroke/TIAs, personally independently reviewed imaging studies and stroke evaluation results and answered questions.Continue ASA and Eliquis for secondary stroke prevention and maintain strict control of hypertension with blood pressure with goal a ;little higher due to atherosclerosis 130-150 SBP, , diabetes with hemoglobin A1c goal below  6.5% and lipids with LDL cholesterol goal below 70 mg/dL. I also advised the patient to eat a healthy diet with plenty of whole grains, cereals, fruits and vegetables, exercise as tolerated .Followup in the future in 4 months months or  call earlier if necessary.  Naomie DeanAntonia Ahern, MD  [facility] [address] [place], [place] [id number]  Phone [phone] Fax [phone]  A total of 30 minutes was spent face-to-face with this patient. Over half this time was spent on counseling patient on the spastic hemiparesis and embolic stroke, dementia diagnosis and different diagnostic and therapeutic options available.

## 2016-09-08 ENCOUNTER — Other Ambulatory Visit (HOSPITAL_COMMUNITY): Payer: Self-pay | Admitting: Internal Medicine

## 2016-09-08 DIAGNOSIS — R4182 Altered mental status, unspecified: Secondary | ICD-10-CM

## 2016-09-11 ENCOUNTER — Encounter (HOSPITAL_COMMUNITY): Payer: Self-pay

## 2016-09-11 ENCOUNTER — Ambulatory Visit (HOSPITAL_COMMUNITY): Payer: Medicare Other

## 2016-12-08 ENCOUNTER — Ambulatory Visit: Payer: Medicare Other | Admitting: Neurology

## 2018-06-24 IMAGING — CR DG CHEST 2V
2 series · 2 of 2 positions shown · non-contrast
Comparison: 04/08/2012

CLINICAL DATA: Cough and congestion a few weeks.

EXAM:
CHEST  2 VIEW

[w chest pa]
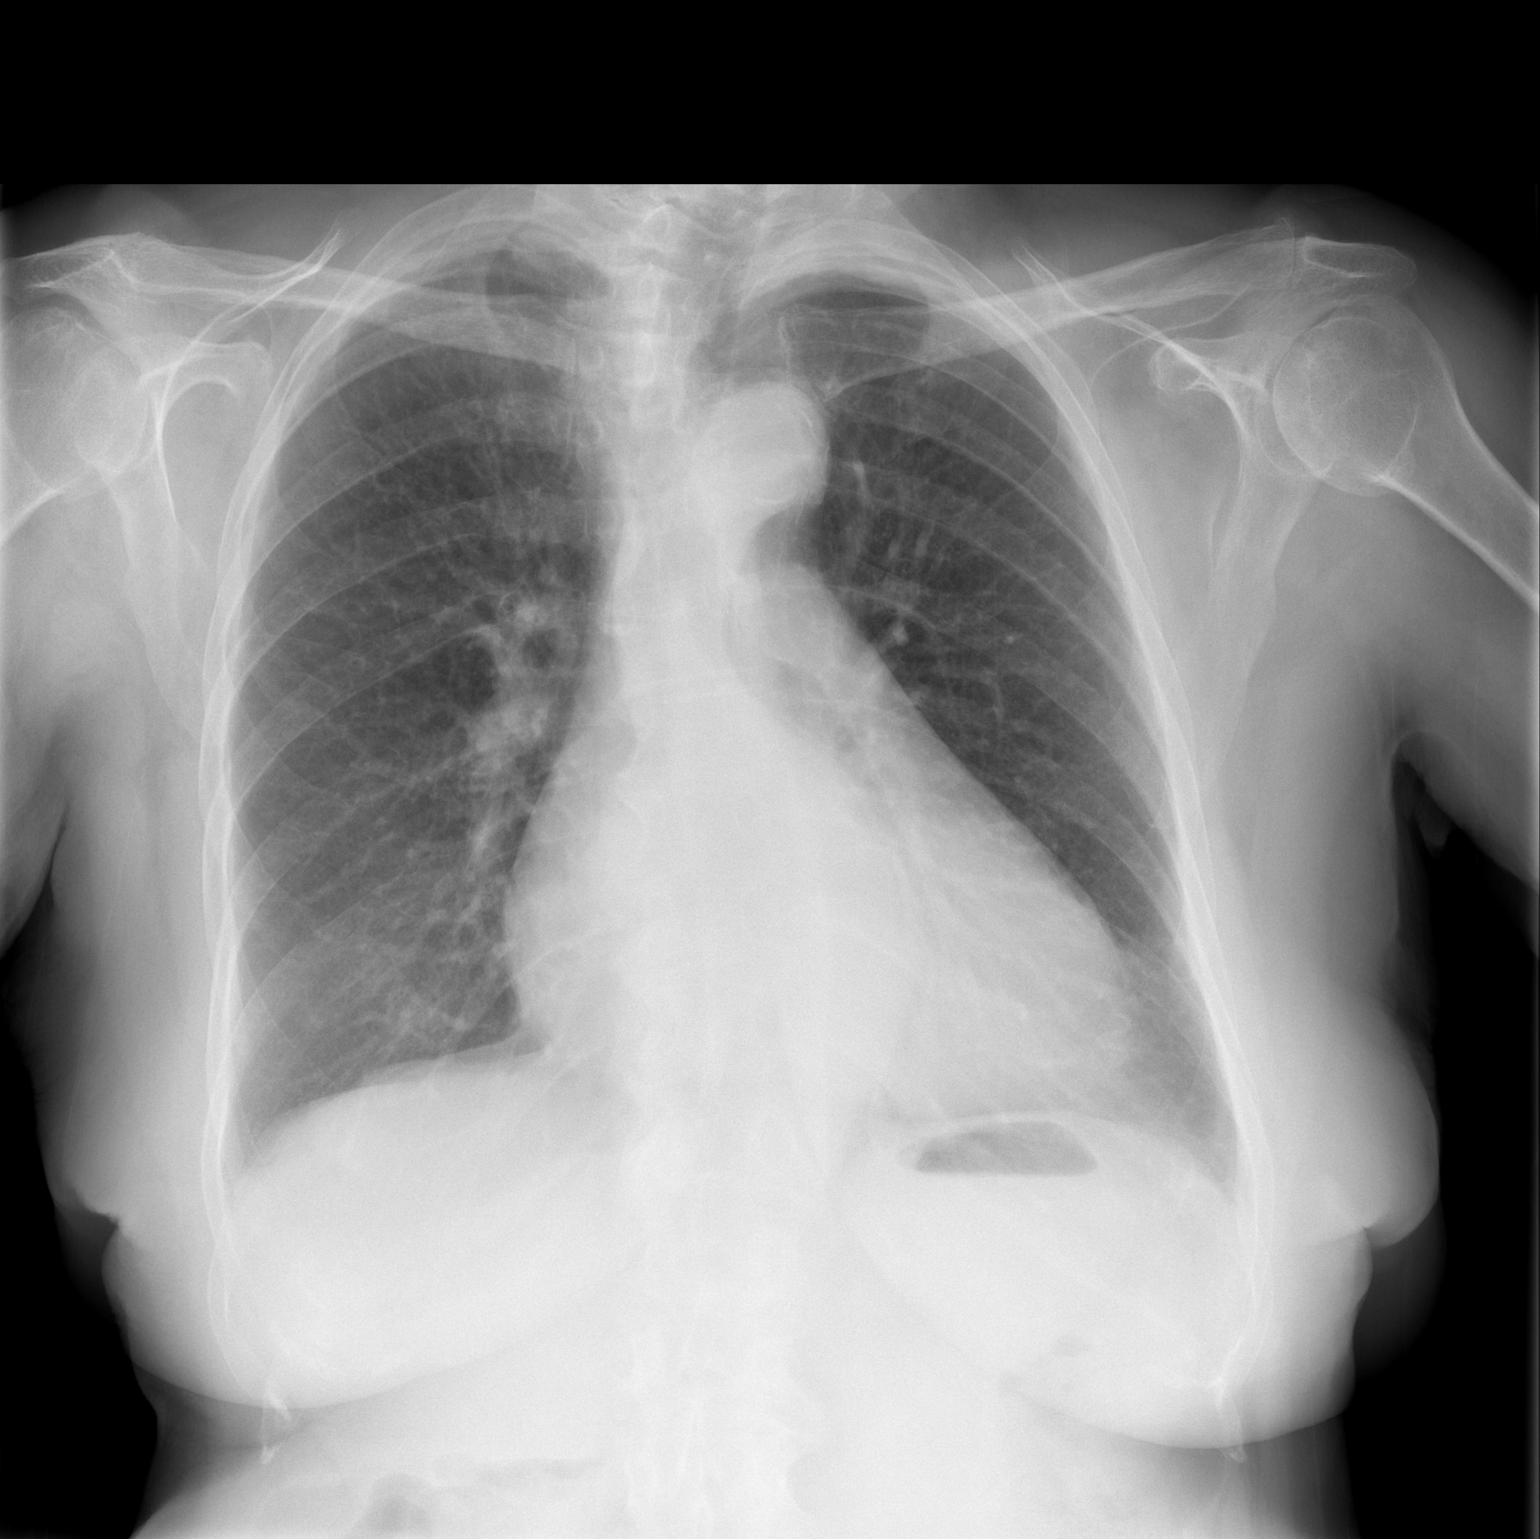

[w chest lat]
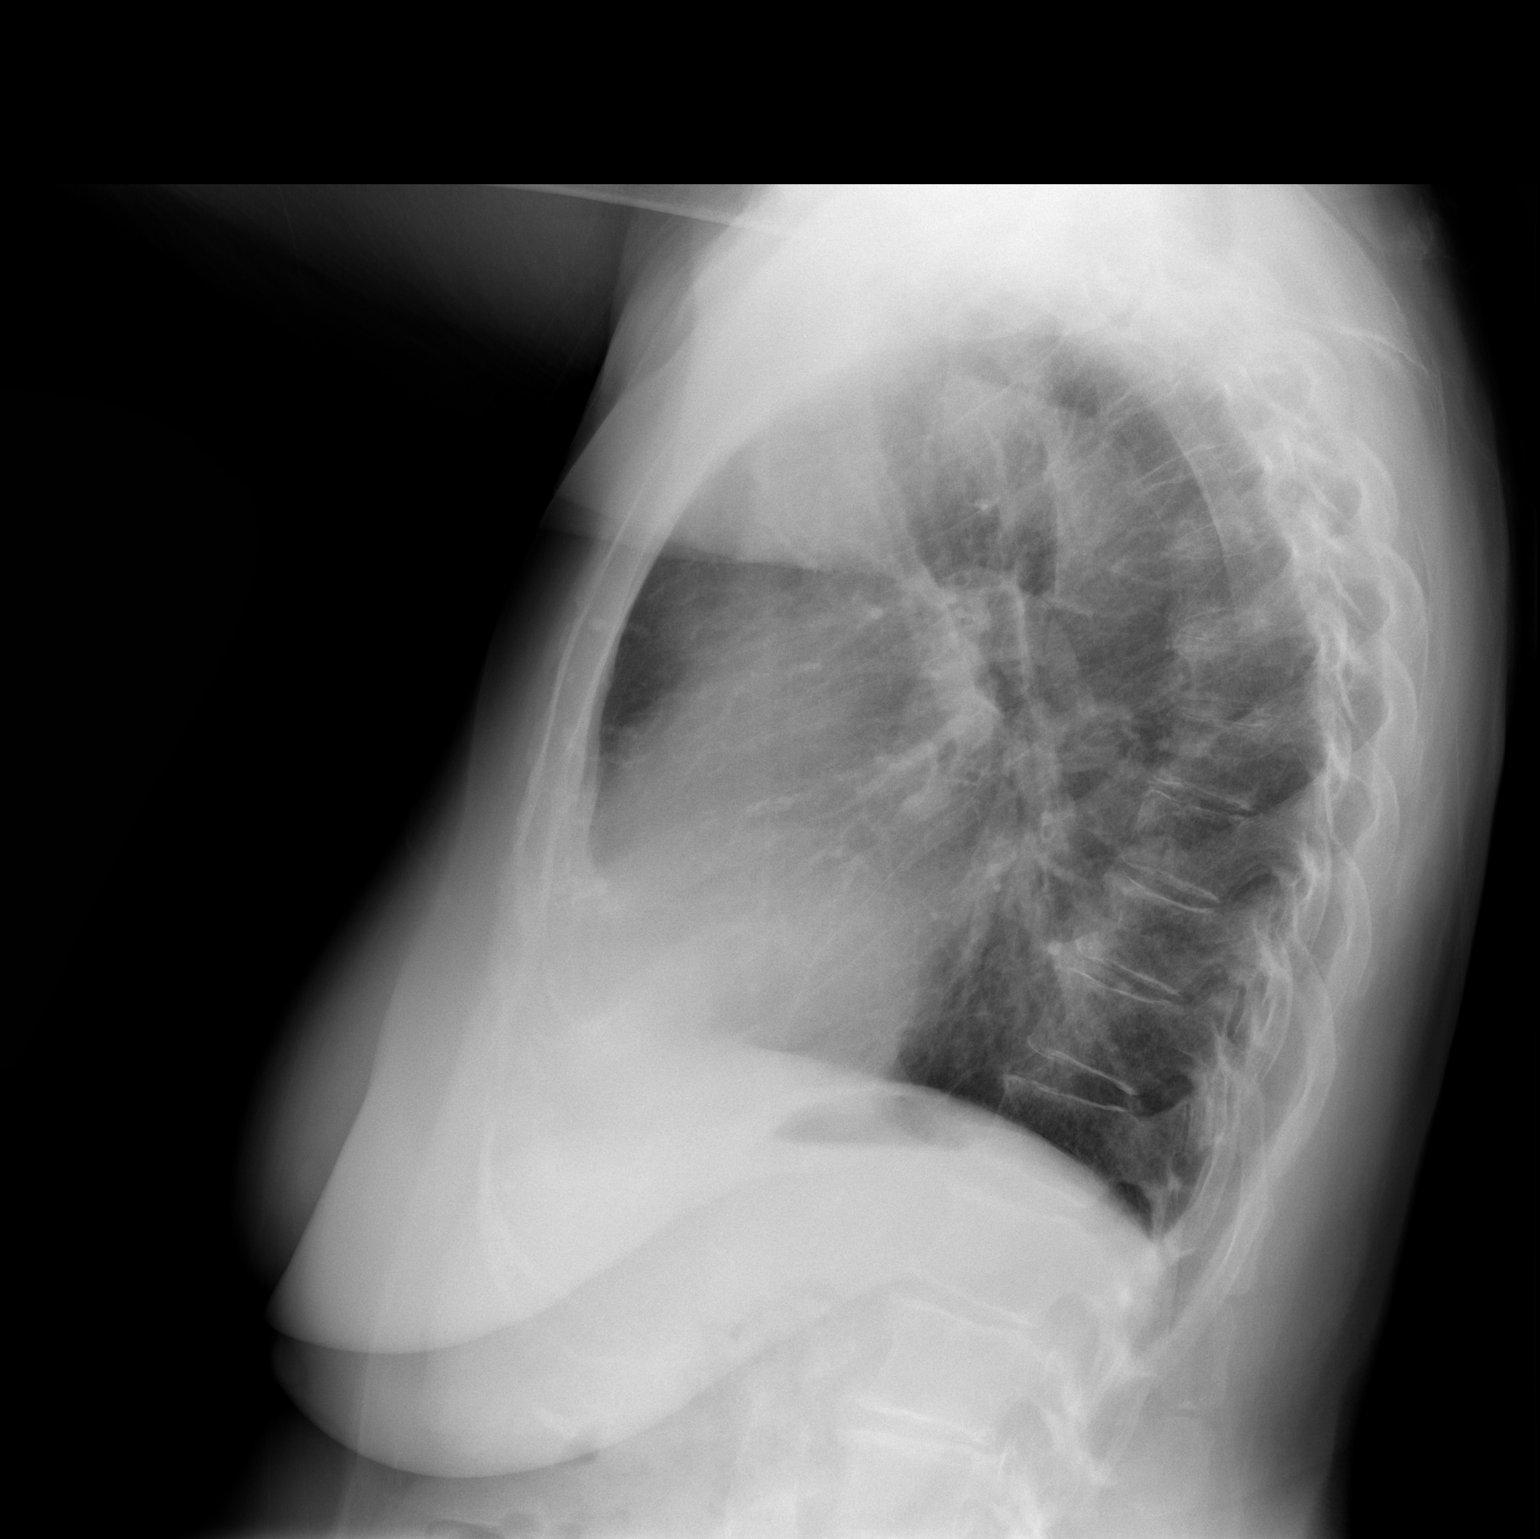

[2 of 2 positions shown; findings below may reference images not displayed]

FINDINGS: Lungs are adequately inflated without focal consolidation or
effusion. Mild-to-moderate stable cardiomegaly. Mild calcified
plaque over the thoracic aorta. Degenerative change of the spine.
IMPRESSION: No acute cardiopulmonary disease.

Stable mild to moderate cardiomegaly.

Aortic atherosclerosis.

## 2018-11-24 IMAGING — CT CT ANGIO HEAD
1 of 12 series · 4 of 33 positions shown · IV contrast (isovue)
Comparison: 06/25/2016 MRI of the brain.

CLINICAL DATA: 83 y/o F; history of stroke, TIA, and atrial
fibrillation.

EXAM:
CT ANGIOGRAPHY HEAD AND NECK
TECHNIQUE: Multidetector CT imaging of the head and neck was performed using
the standard protocol during bolus administration of intravenous
contrast. Multiplanar CT image reconstructions and MIPs were
obtained to evaluate the vascular anatomy. Carotid stenosis
measurements (when applicable) are obtained utilizing NASCET
criteria, using the distal internal carotid diameter as the
denominator.
CONTRAST:  50 cc Isovue 370

[Series 11: cta neck axial · axial · 0.39mm/px · z∈[-224,-37]mm · 4 of 313 slices shown]
[im 63/313  soft-tissue]
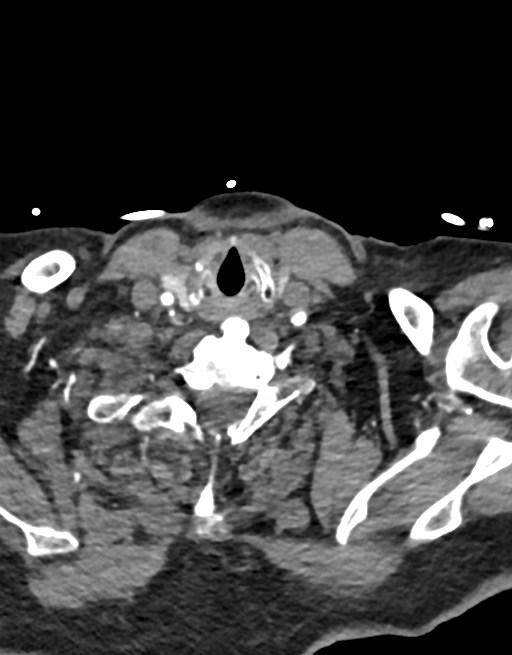
[im 125/313  bone]
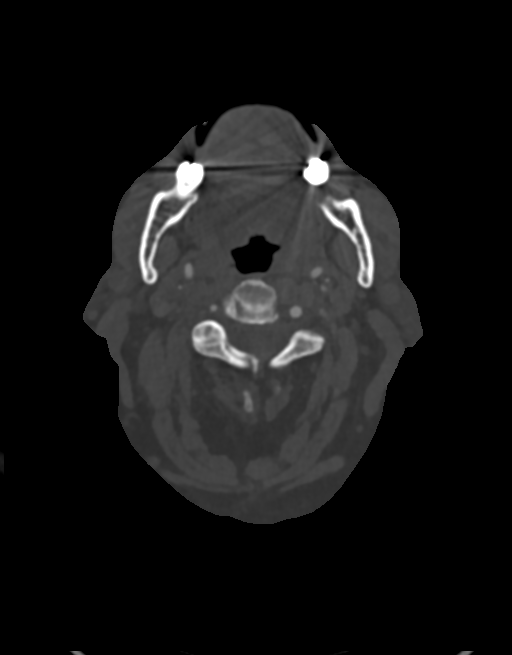
[im 188/313  soft-tissue]
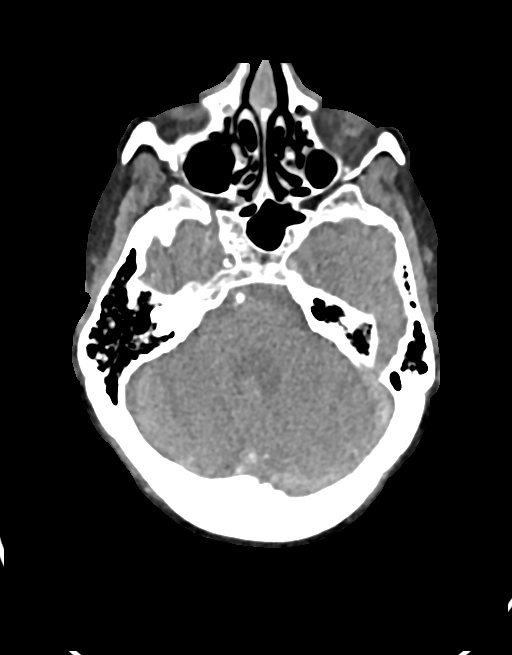
[im 250/313  bone]
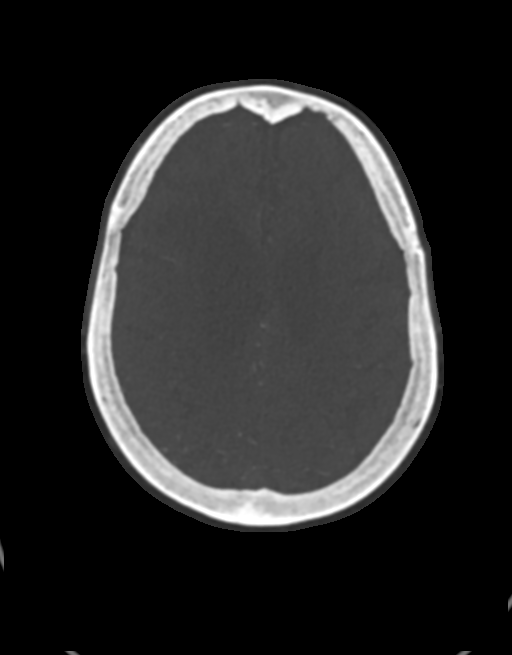

[4 of 33 positions shown; findings below may reference images not displayed]

FINDINGS: CT HEAD FINDINGS

Brain: Small right frontal lobe and right inferior cerebellar
hemisphere chronic infarcts. Nonspecific left posterior temporal
calcification, likely sequelae of prior infectious/inflammatory
process. Moderate chronic microvascular ischemic changes of white
matter and moderate brain parenchymal volume loss. Small infarcts on
prior MRI are not appreciable on CT. No hydrocephalus, intracranial
hemorrhage, or effacement of basilar cisterns. No evidence for new
large territory infarct. Incidental subcentimeter anterior falx
lipoma.

Vascular: As below.

Skull: Normal. Negative for fracture or focal lesion.

Sinuses: Imaged portions are clear.

Orbits: Bilateral intra-ocular lens replacement.

Review of the MIP images confirms the above findings

CTA NECK FINDINGS

Aortic arch: Aberrant right subclavian artery. Mild calcific
atherosclerosis of the aortic arch. No evidence for dissection,
vasculitis, or significant stenosis of great vessel origins.

Right carotid system: Patent right common carotid artery. Complete
occlusion of the right internal carotid artery two-view terminus.

Left carotid system: Patent left common carotid artery. Near
complete occlusion of the left internal carotid artery in the neck
with intermittent thread-like opacity.

Vertebral arteries: Left dominant. Dense calcification of right
vertebral artery origin with probable stenosis, accurate assessment
is limited due to the dense calcification. Widely patent left
vertebral artery.

Skeleton: Cervical spondylosis with moderate bilateral facet
degenerative changes most pronounced at the C3 through C5 levels and
severe discogenic degenerative changes at C5 through C7.
Uncovertebral hypertrophy results in foraminal narrowing greatest at
the left C6-7 and right C5-6 levels. No high-grade bony canal
stenosis.

Other neck: Negative.

Upper chest: Negative.

Review of the MIP images confirms the above findings

CTA HEAD FINDINGS

Anterior circulation: Complete occlusion of the right internal
carotid artery to the terminus. The left internal carotid artery is
occluded in the petrous and cavernous segments with minimal patency
of paraclinoid and terminal segments. Bilateral anterior cerebral
artery and middle cerebral artery distributions are patent although
the vessels are diffusely diminished in caliber.

Posterior circulation: Mild irregularity of the basilar artery with
short segment of mid stenosis of the mid segment. Mild bilateral P1
segments of stenosis. Otherwise no evidence for high-grade stenosis,
aneurysm, vascular malformation, or proximal occlusion.

Venous sinuses: As permitted by contrast timing, patent.

Anatomic variants: Small anterior communicating artery. No posterior
communicating artery identified, likely hypoplastic or absent.

Delayed phase: No abnormal intracranial enhancement.

Review of the MIP images confirms the above findings
IMPRESSION: 1. Small right frontal and right inferior cerebellar hemisphere
chronic infarcts.
2. Small acute infarcts on the prior MRI of the brain are not
appreciable on CT.
3. Moderate chronic microvascular ischemic changes and moderate
parenchymal volume loss of the brain.
4. Complete occlusion of right internal carotid artery to the
terminus and near complete occlusion of the left internal carotid
artery with minimal patency of left paraclinoid and terminal
segments.
5. Patent circle of Willis without large vessel occlusion, aneurysm,
or vascular malformation.
6. Diffusely diminished caliber of the anterior cerebral artery and
middle cerebral artery distributions.
7. Moderate midbasilar and bilateral P1 segment foci of stenosis
compatible with intracranial atherosclerosis.
These results will be called to the ordering clinician or
representative by the Radiologist Assistant, and communication
documented in the PACS or zVision Dashboard.

By: Krekor Minadaki M.D.

## 2018-11-30 DEATH — deceased
